# Patient Record
Sex: Male | Born: 1955 | Race: White | Hispanic: No | Marital: Married | Smoking: Former smoker
Health system: Southern US, Community
[De-identification: ages and names within clinical notes are randomized; demographics above are authoritative.]

## PROBLEM LIST (undated history)

## (undated) DIAGNOSIS — Z87442 Personal history of urinary calculi: Secondary | ICD-10-CM

## (undated) DIAGNOSIS — E291 Testicular hypofunction: Secondary | ICD-10-CM

## (undated) DIAGNOSIS — G8929 Other chronic pain: Secondary | ICD-10-CM

## (undated) DIAGNOSIS — M1990 Unspecified osteoarthritis, unspecified site: Secondary | ICD-10-CM

## (undated) DIAGNOSIS — N400 Enlarged prostate without lower urinary tract symptoms: Secondary | ICD-10-CM

## (undated) DIAGNOSIS — M549 Dorsalgia, unspecified: Secondary | ICD-10-CM

## (undated) HISTORY — PX: FINGER AMPUTATION: SHX636

## (undated) HISTORY — PX: COLONOSCOPY: SHX174

## (undated) HISTORY — PX: LUMBAR FUSION: SHX111

## (undated) HISTORY — DX: Testicular hypofunction: E29.1

---

## 1998-06-02 ENCOUNTER — Encounter: Admission: RE | Admit: 1998-06-02 | Discharge: 1998-08-31 | Payer: Self-pay | Admitting: Anesthesiology

## 1998-09-16 ENCOUNTER — Encounter: Admission: RE | Admit: 1998-09-16 | Discharge: 1998-12-15 | Payer: Self-pay | Admitting: Anesthesiology

## 1998-12-16 ENCOUNTER — Encounter: Admission: RE | Admit: 1998-12-16 | Discharge: 1999-03-04 | Payer: Self-pay | Admitting: Anesthesiology

## 1999-03-04 ENCOUNTER — Encounter: Admission: RE | Admit: 1999-03-04 | Discharge: 1999-06-02 | Payer: Self-pay | Admitting: Anesthesiology

## 1999-06-02 ENCOUNTER — Encounter: Admission: RE | Admit: 1999-06-02 | Discharge: 1999-08-18 | Payer: Self-pay | Admitting: Anesthesiology

## 1999-06-27 ENCOUNTER — Ambulatory Visit (HOSPITAL_COMMUNITY): Admission: RE | Admit: 1999-06-27 | Discharge: 1999-06-27 | Payer: Self-pay | Admitting: Family Medicine

## 1999-06-27 ENCOUNTER — Encounter: Payer: Self-pay | Admitting: Family Medicine

## 1999-08-18 ENCOUNTER — Encounter: Admission: RE | Admit: 1999-08-18 | Discharge: 1999-11-16 | Payer: Self-pay | Admitting: Anesthesiology

## 1999-10-19 ENCOUNTER — Emergency Department (HOSPITAL_COMMUNITY): Admission: EM | Admit: 1999-10-19 | Discharge: 1999-10-19 | Payer: Self-pay | Admitting: Emergency Medicine

## 1999-10-19 ENCOUNTER — Encounter: Payer: Self-pay | Admitting: Emergency Medicine

## 1999-12-09 ENCOUNTER — Encounter: Admission: RE | Admit: 1999-12-09 | Discharge: 2000-03-08 | Payer: Self-pay | Admitting: Anesthesiology

## 1999-12-16 ENCOUNTER — Ambulatory Visit (HOSPITAL_COMMUNITY): Admission: RE | Admit: 1999-12-16 | Discharge: 1999-12-16 | Payer: Self-pay | Admitting: Gastroenterology

## 1999-12-29 ENCOUNTER — Encounter: Payer: Self-pay | Admitting: Gastroenterology

## 1999-12-29 ENCOUNTER — Ambulatory Visit (HOSPITAL_COMMUNITY): Admission: RE | Admit: 1999-12-29 | Discharge: 1999-12-29 | Payer: Self-pay | Admitting: Gastroenterology

## 2000-04-05 ENCOUNTER — Encounter: Admission: RE | Admit: 2000-04-05 | Discharge: 2000-05-31 | Payer: Self-pay | Admitting: Anesthesiology

## 2000-05-31 ENCOUNTER — Encounter: Admission: RE | Admit: 2000-05-31 | Discharge: 2000-08-29 | Payer: Self-pay | Admitting: Anesthesiology

## 2000-08-28 ENCOUNTER — Encounter: Admission: RE | Admit: 2000-08-28 | Discharge: 2000-11-26 | Payer: Self-pay | Admitting: Anesthesiology

## 2000-12-07 ENCOUNTER — Encounter: Admission: RE | Admit: 2000-12-07 | Discharge: 2000-12-07 | Payer: Self-pay | Admitting: Gastroenterology

## 2000-12-07 ENCOUNTER — Encounter: Payer: Self-pay | Admitting: Gastroenterology

## 2000-12-12 ENCOUNTER — Encounter: Admission: RE | Admit: 2000-12-12 | Discharge: 2001-03-12 | Payer: Self-pay | Admitting: Anesthesiology

## 2001-01-30 ENCOUNTER — Encounter: Payer: Self-pay | Admitting: Gastroenterology

## 2001-01-30 ENCOUNTER — Encounter: Admission: RE | Admit: 2001-01-30 | Discharge: 2001-01-30 | Payer: Self-pay | Admitting: Gastroenterology

## 2001-03-11 ENCOUNTER — Encounter: Admission: RE | Admit: 2001-03-11 | Discharge: 2001-06-09 | Payer: Self-pay | Admitting: Anesthesiology

## 2001-07-29 ENCOUNTER — Encounter: Admission: RE | Admit: 2001-07-29 | Discharge: 2001-08-24 | Payer: Self-pay | Admitting: Anesthesiology

## 2003-03-03 ENCOUNTER — Emergency Department (HOSPITAL_COMMUNITY): Admission: EM | Admit: 2003-03-03 | Discharge: 2003-03-04 | Payer: Self-pay | Admitting: *Deleted

## 2003-04-24 ENCOUNTER — Ambulatory Visit (HOSPITAL_COMMUNITY): Admission: RE | Admit: 2003-04-24 | Discharge: 2003-04-24 | Payer: Self-pay | Admitting: General Surgery

## 2003-09-19 ENCOUNTER — Emergency Department (HOSPITAL_COMMUNITY): Admission: EM | Admit: 2003-09-19 | Discharge: 2003-09-19 | Payer: Self-pay | Admitting: *Deleted

## 2003-09-19 ENCOUNTER — Encounter: Payer: Self-pay | Admitting: *Deleted

## 2007-11-14 ENCOUNTER — Emergency Department (HOSPITAL_COMMUNITY): Admission: EM | Admit: 2007-11-14 | Discharge: 2007-11-14 | Payer: Self-pay | Admitting: Emergency Medicine

## 2007-11-14 LAB — CONVERTED CEMR LAB
Albumin: 3.6 g/dL
Alkaline Phosphatase: 58 units/L
BUN: 8 mg/dL
Calcium: 8.4 mg/dL
Chloride: 106 meq/L
Creatinine, Ser: 0.74 mg/dL
Glucose, Bld: 98 mg/dL
Hemoglobin: 13.4 g/dL
Lymphocytes Relative: 21 %
Monocytes Absolute: 0.5 10*3/uL
Monocytes Relative: 8 %
Neutro Abs: 4.6 10*3/uL
Potassium: 3.8 meq/L
RBC: 4.95 M/uL

## 2007-12-30 ENCOUNTER — Ambulatory Visit: Payer: Self-pay | Admitting: Internal Medicine

## 2007-12-30 DIAGNOSIS — M545 Low back pain, unspecified: Secondary | ICD-10-CM | POA: Insufficient documentation

## 2007-12-30 DIAGNOSIS — Z8719 Personal history of other diseases of the digestive system: Secondary | ICD-10-CM

## 2007-12-30 DIAGNOSIS — J309 Allergic rhinitis, unspecified: Secondary | ICD-10-CM | POA: Insufficient documentation

## 2007-12-30 DIAGNOSIS — N401 Enlarged prostate with lower urinary tract symptoms: Secondary | ICD-10-CM

## 2007-12-30 DIAGNOSIS — M129 Arthropathy, unspecified: Secondary | ICD-10-CM | POA: Insufficient documentation

## 2007-12-30 DIAGNOSIS — N318 Other neuromuscular dysfunction of bladder: Secondary | ICD-10-CM | POA: Insufficient documentation

## 2007-12-30 DIAGNOSIS — R5383 Other fatigue: Secondary | ICD-10-CM

## 2007-12-30 DIAGNOSIS — R5381 Other malaise: Secondary | ICD-10-CM | POA: Insufficient documentation

## 2007-12-30 DIAGNOSIS — N400 Enlarged prostate without lower urinary tract symptoms: Secondary | ICD-10-CM | POA: Insufficient documentation

## 2007-12-30 DIAGNOSIS — K219 Gastro-esophageal reflux disease without esophagitis: Secondary | ICD-10-CM | POA: Insufficient documentation

## 2007-12-30 LAB — CONVERTED CEMR LAB
Blood in Urine, dipstick: NEGATIVE
Nitrite: NEGATIVE
Specific Gravity, Urine: 1.015
WBC Urine, dipstick: NEGATIVE

## 2007-12-31 ENCOUNTER — Telehealth (INDEPENDENT_AMBULATORY_CARE_PROVIDER_SITE_OTHER): Payer: Self-pay | Admitting: *Deleted

## 2007-12-31 LAB — CONVERTED CEMR LAB
Basophils Absolute: 0 10*3/uL (ref 0.0–0.1)
Basophils Relative: 0 % (ref 0–1)
CO2: 25 meq/L (ref 19–32)
Calcium: 9.3 mg/dL (ref 8.4–10.5)
Creatinine, Ser: 0.78 mg/dL (ref 0.40–1.50)
Eosinophils Relative: 3 % (ref 0–5)
HCT: 45.4 % (ref 39.0–52.0)
Hemoglobin: 14.8 g/dL (ref 13.0–17.0)
MCHC: 32.6 g/dL (ref 30.0–36.0)
MCV: 83.9 fL (ref 78.0–100.0)
Monocytes Absolute: 0.8 10*3/uL (ref 0.1–1.0)
PSA: 0.61 ng/mL (ref 0.10–4.00)
RDW: 13.1 % (ref 11.5–15.5)
Testosterone: 160.06 ng/dL — ABNORMAL LOW (ref 350–890)

## 2008-01-01 ENCOUNTER — Ambulatory Visit: Payer: Self-pay | Admitting: Internal Medicine

## 2008-01-01 DIAGNOSIS — E349 Endocrine disorder, unspecified: Secondary | ICD-10-CM | POA: Insufficient documentation

## 2008-01-01 DIAGNOSIS — E291 Testicular hypofunction: Secondary | ICD-10-CM

## 2008-02-03 ENCOUNTER — Ambulatory Visit: Payer: Self-pay | Admitting: Internal Medicine

## 2008-03-02 ENCOUNTER — Ambulatory Visit: Payer: Self-pay | Admitting: Internal Medicine

## 2008-03-30 ENCOUNTER — Ambulatory Visit: Payer: Self-pay | Admitting: Internal Medicine

## 2008-03-31 LAB — CONVERTED CEMR LAB
Albumin: 4 g/dL (ref 3.5–5.2)
Alkaline Phosphatase: 55 units/L (ref 39–117)
Basophils Absolute: 0 10*3/uL (ref 0.0–0.1)
Basophils Relative: 0 % (ref 0–1)
Eosinophils Relative: 4 % (ref 0–5)
HCT: 45.1 % (ref 39.0–52.0)
Lymphocytes Relative: 22 % (ref 12–46)
Platelets: 186 10*3/uL (ref 150–400)
RDW: 13.6 % (ref 11.5–15.5)
Total Bilirubin: 0.6 mg/dL (ref 0.3–1.2)

## 2008-04-04 ENCOUNTER — Emergency Department (HOSPITAL_COMMUNITY): Admission: EM | Admit: 2008-04-04 | Discharge: 2008-04-04 | Payer: Self-pay | Admitting: Emergency Medicine

## 2008-04-29 ENCOUNTER — Ambulatory Visit: Payer: Self-pay | Admitting: Internal Medicine

## 2008-05-14 ENCOUNTER — Ambulatory Visit: Payer: Self-pay | Admitting: Internal Medicine

## 2008-05-14 DIAGNOSIS — J069 Acute upper respiratory infection, unspecified: Secondary | ICD-10-CM | POA: Insufficient documentation

## 2008-05-28 ENCOUNTER — Ambulatory Visit: Payer: Self-pay | Admitting: Internal Medicine

## 2008-06-25 ENCOUNTER — Ambulatory Visit: Payer: Self-pay | Admitting: Internal Medicine

## 2008-06-30 LAB — CONVERTED CEMR LAB
AST: 15 units/L (ref 0–37)
Albumin: 4.2 g/dL (ref 3.5–5.2)
Alkaline Phosphatase: 54 units/L (ref 39–117)
Eosinophils Relative: 4 % (ref 0–5)
HCT: 45.4 % (ref 39.0–52.0)
Hemoglobin: 14.9 g/dL (ref 13.0–17.0)
Indirect Bilirubin: 0.6 mg/dL (ref 0.0–0.9)
Lymphocytes Relative: 21 % (ref 12–46)
Lymphs Abs: 1.6 10*3/uL (ref 0.7–4.0)
Monocytes Absolute: 0.7 10*3/uL (ref 0.1–1.0)
Monocytes Relative: 9 % (ref 3–12)
RBC: 5.42 M/uL (ref 4.22–5.81)
Total Protein: 6.7 g/dL (ref 6.0–8.3)
WBC: 7.4 10*3/uL (ref 4.0–10.5)

## 2008-07-23 ENCOUNTER — Telehealth (INDEPENDENT_AMBULATORY_CARE_PROVIDER_SITE_OTHER): Payer: Self-pay | Admitting: *Deleted

## 2008-07-27 ENCOUNTER — Ambulatory Visit: Payer: Self-pay | Admitting: Internal Medicine

## 2008-07-27 DIAGNOSIS — D485 Neoplasm of uncertain behavior of skin: Secondary | ICD-10-CM

## 2008-08-11 ENCOUNTER — Telehealth (INDEPENDENT_AMBULATORY_CARE_PROVIDER_SITE_OTHER): Payer: Self-pay | Admitting: *Deleted

## 2008-09-28 ENCOUNTER — Ambulatory Visit: Payer: Self-pay | Admitting: Internal Medicine

## 2008-10-01 LAB — CONVERTED CEMR LAB
AST: 26 units/L (ref 0–37)
Albumin: 4.4 g/dL (ref 3.5–5.2)
Alkaline Phosphatase: 48 units/L (ref 39–117)
BUN: 7 mg/dL (ref 6–23)
Basophils Relative: 0 % (ref 0–1)
Eosinophils Absolute: 0.2 10*3/uL (ref 0.0–0.7)
Eosinophils Relative: 3 % (ref 0–5)
HCT: 51.3 % (ref 39.0–52.0)
MCHC: 32.2 g/dL (ref 30.0–36.0)
MCV: 84.1 fL (ref 78.0–100.0)
Monocytes Relative: 12 % (ref 3–12)
Neutrophils Relative %: 63 % (ref 43–77)
PSA: 0.52 ng/mL (ref 0.10–4.00)
Platelets: 226 10*3/uL (ref 150–400)
Potassium: 4.9 meq/L (ref 3.5–5.3)
Sodium: 141 meq/L (ref 135–145)
Total Bilirubin: 0.9 mg/dL (ref 0.3–1.2)

## 2008-11-02 ENCOUNTER — Ambulatory Visit: Payer: Self-pay | Admitting: Internal Medicine

## 2008-11-06 ENCOUNTER — Telehealth (INDEPENDENT_AMBULATORY_CARE_PROVIDER_SITE_OTHER): Payer: Self-pay | Admitting: Internal Medicine

## 2008-11-09 ENCOUNTER — Encounter (INDEPENDENT_AMBULATORY_CARE_PROVIDER_SITE_OTHER): Payer: Self-pay | Admitting: Internal Medicine

## 2008-11-17 ENCOUNTER — Encounter (INDEPENDENT_AMBULATORY_CARE_PROVIDER_SITE_OTHER): Payer: Self-pay | Admitting: Internal Medicine

## 2008-12-01 ENCOUNTER — Telehealth (INDEPENDENT_AMBULATORY_CARE_PROVIDER_SITE_OTHER): Payer: Self-pay | Admitting: *Deleted

## 2008-12-28 ENCOUNTER — Ambulatory Visit: Payer: Self-pay | Admitting: Internal Medicine

## 2008-12-29 LAB — CONVERTED CEMR LAB
Alkaline Phosphatase: 60 units/L (ref 39–117)
BUN: 10 mg/dL (ref 6–23)
CO2: 22 meq/L (ref 19–32)
Creatinine, Ser: 0.77 mg/dL (ref 0.40–1.50)
Eosinophils Absolute: 0.2 10*3/uL (ref 0.0–0.7)
Eosinophils Relative: 3 % (ref 0–5)
Glucose, Bld: 92 mg/dL (ref 70–99)
HCT: 51 % (ref 39.0–52.0)
Hemoglobin: 16.6 g/dL (ref 13.0–17.0)
Lymphocytes Relative: 21 % (ref 12–46)
Lymphs Abs: 1.3 10*3/uL (ref 0.7–4.0)
MCV: 83.3 fL (ref 78.0–100.0)
Monocytes Absolute: 0.6 10*3/uL (ref 0.1–1.0)
Monocytes Relative: 9 % (ref 3–12)
Platelets: 205 10*3/uL (ref 150–400)
Testosterone: 229.71 ng/dL — ABNORMAL LOW (ref 350–890)
Total Bilirubin: 0.9 mg/dL (ref 0.3–1.2)
Total Protein: 6.6 g/dL (ref 6.0–8.3)
WBC: 6.2 10*3/uL (ref 4.0–10.5)

## 2009-03-29 ENCOUNTER — Ambulatory Visit: Payer: Self-pay | Admitting: Internal Medicine

## 2009-03-30 ENCOUNTER — Encounter (INDEPENDENT_AMBULATORY_CARE_PROVIDER_SITE_OTHER): Payer: Self-pay | Admitting: Internal Medicine

## 2009-03-30 LAB — CONVERTED CEMR LAB
FSH: 1.1 milliintl units/mL — ABNORMAL LOW (ref 1.4–18.1)
Iron: 174 ug/dL — ABNORMAL HIGH (ref 42–165)
Saturation Ratios: 55 % (ref 20–55)
TIBC: 319 ug/dL (ref 215–435)

## 2009-04-01 DIAGNOSIS — E230 Hypopituitarism: Secondary | ICD-10-CM

## 2009-04-01 LAB — CONVERTED CEMR LAB
ALT: 28 units/L (ref 0–53)
Alkaline Phosphatase: 65 units/L (ref 39–117)
Bilirubin, Direct: 0.2 mg/dL (ref 0.0–0.3)
Eosinophils Absolute: 0.2 10*3/uL (ref 0.0–0.7)
Eosinophils Relative: 3 % (ref 0–5)
HCT: 50.2 % (ref 39.0–52.0)
Indirect Bilirubin: 0.7 mg/dL (ref 0.0–0.9)
Lymphocytes Relative: 19 % (ref 12–46)
Lymphs Abs: 1.4 10*3/uL (ref 0.7–4.0)
MCV: 83.4 fL (ref 78.0–100.0)
Monocytes Relative: 8 % (ref 3–12)
PSA: 0.72 ng/mL (ref 0.10–4.00)
Platelets: 195 10*3/uL (ref 150–400)
RBC: 6.02 M/uL — ABNORMAL HIGH (ref 4.22–5.81)
WBC: 7.5 10*3/uL (ref 4.0–10.5)

## 2009-04-07 ENCOUNTER — Encounter (INDEPENDENT_AMBULATORY_CARE_PROVIDER_SITE_OTHER): Payer: Self-pay | Admitting: Internal Medicine

## 2009-04-07 ENCOUNTER — Ambulatory Visit (HOSPITAL_COMMUNITY): Admission: RE | Admit: 2009-04-07 | Discharge: 2009-04-07 | Payer: Self-pay | Admitting: Internal Medicine

## 2011-01-24 NOTE — Assessment & Plan Note (Signed)
Summary: testosterone injection and education of wife   Vital Signs:  Patient Profile:   55 Years Old Male Height:     68 inches O2 Sat:      96 % O2 treatment:    Room Air Pulse rate:   64 / minute Resp:     9 per minute BP sitting:   118 / 78  (right arm)  Vitals Entered By: Lutricia Horsfall (June 25, 2008 11:34 AM)                 Chief Complaint:  testosterone.    Current Allergies: No known allergies         Complete Medication List: 1)  Methadone Hcl 5 Mg Tabs (Methadone hcl) 2)  Valium 5 Mg Tabs (Diazepam) 3)  Flomax 0.4 Mg Cp24 (Tamsulosin hcl) .Marland Kitchen.. 1 by mouth at bedtime    ]  Medication Administration  Injection # 1:    Medication: Testosterone Cypionat 200mg  ing    Diagnosis: HYPOGONADISM, MALE (ICD-257.2)    Route: IM    Site: L deltoid    Exp Date: 11/11    Lot #: [id number]    Mfr: pfizer    Patient tolerated injection without complications    Given by: Lutricia Horsfall (June 25, 2008 11:34 AM)  Orders Added: 1)  Testosterone Cypionat 200mg  ing [J1080] 2)  Admin of Therapeutic Inj  intramuscular or subcutaneous [96372] 3)  T-Hepatic Function [80076-22960] 4)  T-CBC w/Diff [16109-60454] 5)  T-PSA  [[id number]] 6)  T-Testosterone; Total [phone] Wife instructed on giving IM injections.  Wife able to return demonstration and successfully give injection to patient.  Wife and patient instructed that if they have questions or concerns prior to next injection thaey should bring medication and supplies to office and we can go over injections again.  Pt and wife verbalized understanding.  Sherrie [place]  June 25, 2008 11:37 AM   Labs drawn.

## 2011-05-12 NOTE — H&P (Signed)
[facility]  Patient:    Bernard Hill, Bernard Hill                        MRN: [id number] Attending:  Thyra Breed, M.D.                         History and Physical  NO DICTATION. DD:  02/11/01 TD:  02/11/01 Job: [id number]

## 2011-05-12 NOTE — H&P (Signed)
[facility]  Patient:    Bernard Hill, Bernard Hill                      MRN: [id number] Adm. Date:  [id number] Disc. Date: [id number] Attending:  Thyra Breed CC:         Donzetta Sprung, M.D.   History and Physical  FOLLOW-UP EVALUATION:  [name] comes in for follow-up evaluation of his chronic low back pain on the basis of lumbar spondylosis with underlying lumbar radiculopathy. He has noted that his pain seems to be worse since reducing the dose of the methadone, and he saw one of Dr. Garner Nash associates who apparently frustrated him more than helped him with regard to his edema. He did get some workup done, but he walked away frustrated that his concerns were not taken seriously. He is hoping to see Dr. Reuel Boom in October.  He continues on methadone 5 mg one p.o. q.6h., Valium 5 mg p.r.n., and Topamax 25 mg three times a day. He does not feel as though the Topamax is quite as helpful as it has been at suppressing his appetite or helping with his pain control.  He denies any new symptoms but continues to have pain on the left lower side of his back and the lateral aspect of his feet and MTPs. He has noted that walking and swimming have helped, but prolonged standing or sitting will exacerbate his pain.  PHYSICAL EXAMINATION:  VITAL SIGNS:  Blood pressure is 119/75, heart rate is 96, respiratory rate is 16, O2 saturation is 97%, pain level is 6/10.  NEUROLOGICAL:  He has trace edema of the lower extremities to the ankles bilaterally. Deep tendon reflexes were symmetric at the knees, hypoactive at ankles.  IMPRESSION: 1. Lumbar spondylosis with underlying lumbar radiculopathy to the right. 2. Edema.  DISPOSITION: 1. Continue on current dose of methadone 5 mg one p.o. q.6h., #120 with no    refill. 2. Increase Topamax to 25 mg two tablets three times a day, #100 with three    refills. 3. Continue on Valium. 4. Follow up with me in four to eight weeks. 5.  He is to let us know in four weeks if he does not have an appointment so    that we can ascertain whether he is tolerating the Topamax with the    methadone well. DD:  06/04/01 TD:  06/04/01 Job: [id number]

## 2011-05-12 NOTE — H&P (Signed)
[facility]  Patient:    Bernard Hill, Bernard Hill                      MRN: [id number] Adm. Date:  [id number] Attending:  Thyra Breed CC:         Stefani Dama, M.D.  Kari Baars, M.D.   History and Physical  FOLLOW-UP EVALUATION:  [name] comes in for follow-up evaluation of his chronic low back pain on the basis of spondylosis with underlying lumbar radiculopathy. The patient has done about the same on his current medical regimen as he did previously. He has noted that the increased dose of Topamax has not caused any side effects and seems to be helping suppress his appetite better. He continues to need the Valium and the methadone.  He is noting that sitting, standing, or laying down tend to worsen his pain. Swimming does help to a degree. He localizes most of his pain to the lumbosacral region.  PHYSICAL EXAMINATION:  VITAL SIGNS:  Blood pressure is 122/71, heart rate is 86, respiratory rate is 16, O2 saturation is 97%, pain level is 6/10.  NEUROLOGICAL:  Straight leg raise signs are negative. Deep tendon reflexes were symmetric. Gait is intact.  IMPRESSION: 1. Lumbar spondylosis with underlying lumbar radiculopathy. 2. Edema, markedly improved.  DISPOSITION: 1. Continue on current dose of methadone 5 mg one p.o. q.6h., #120 with no    refill. 2. Continue on current dose of Topamax. 3. Valium 5 mg one p.o. q.6h. p.r.n., #100 with two refills. 4. Follow up with me in eight weeks. DD:  07/30/01 TD:  07/30/01 Job: [id number]

## 2011-05-12 NOTE — H&P (Signed)
[facility]  Patient:    [name], [name]                      MRN: [id number] Adm. Date:  [id number] Attending:  Thyra Breed CC:         Donzetta Sprung, M.D. in [place], [place]   History and Physical  FOLLOW-UP EVALUATION:  Bernard Hill comes in today for follow-up. He is complaining of increased pain out into his right lower extremity, as well as to a lesser extent to his left lower extremity. He describes it more as cramping in the calves and burning dysesthesias down his leg. It is made worse by driving, sitting, or standing. He has not noted a great deal of improvement on the increased dose of methadone, Valium, or the Elavil. He is very worried about the fact that it is so much worse. He rates his pain at 8/10. This is higher than his last visit.  PHYSICAL EXAMINATION:  VITAL SIGNS:  Blood pressure is 136/76, heart rate is 98, respiratory rate is 18, O2 saturation is 95%, pain level is 8/10, temperature is 96.8.  NEUROLOGICAL:  Straight leg raise signs were negative. Deep tendon reflexes showed knee symmetric, right ankle absent, left 1+. His motor strength is unchanged from previously. Hyperextension of his back increases his discomfort.  IMPRESSION:  Lumbar spondylosis with radiculopathy into the right lower extremity with increased pain. Rule out possible recurrent disk herniation.  DISPOSITION: 1. The patient wishes to hold off on any radiographic evaluations for the    time being. 2. Discontinue methadone. 3. OxyContin 20 mg one p.o. b.i.d., #60 with no refill. He is to call in 7    days to find out whether he is doing well with this. 4. Continue with the Valium 5 mg one p.o. q.6-8h., #100 with two refills. 5. Continue with Elavil. 6. Follow up with me in 4 weeks. If he is not improved at that time, we will    consider repeat MRI of his lower back. DD:  01/14/01 TD:  01/14/01 Job: [id number]

## 2011-05-12 NOTE — Consult Note (Signed)
[facility]  Patient:    Bernard Hill, Bernard Hill                      MRN: [id number] Proc. Date: 10/23/00 Adm. Date:  [id number] Attending:  Thyra Breed CC:         Donzetta Sprung, M.D., [place], [place]   Consultation Report  FOLLOWUP EVALUATION:  The patient comes in for followup evaluation of his chronic low back pain on the basis of lumbar spondylosis with chronic lumbar radiculopathy.  Since his last evaluation, he continues to exercise regularly by swimming at least twice a week and has noted that this has helped to keep his pain down even better than last year as the colder weather is coming in. He continues on the methadone 10 mg twice a day, which is constipating him. He noted with the Baclofen, he developed a breakout on his face which was blistering, as well as in his mouth.  He stopped this and three days later, this resolved.  He is back on the Valium and it seems to be working better.  EXAMINATION  VITAL SIGNS:  Blood pressure 121/72.  Heart rate is 88.  Respiratory rate is 16.  O2 saturation is 98%.  Pain level is 5/10.  NEUROLOGIC:  He demonstrates symmetric deep tendon reflexes at the knees, attenuated reflex at the right relative to the left ankle.  Motor is unchanged.  IMPRESSION:  Lumbar spondylosis with chronic lumbar radiculopathy.  DISPOSITION 1. Continue on the methadone 10 mg 1 p.o. b.i.d., #60 with no refill. 2. Continue on Valium 5 mg 1 p.o. q.8h., #100 with 2 refills. 3. I gave him the laxative protocol from [place] to review.  I    advised him that as an alternative to Senokot, he could use Senna-S. 4. Follow up with me in eight weeks.  He is aware that he will need to get a    prescription for the methadone in the interim and he will contact us when    he needs this sent to him. DD:  10/23/00 TD:  10/23/00 Job: [id number]

## 2011-05-12 NOTE — H&P (Signed)
[facility]  Patient:    Bernard Hill, Bernard Hill                      MRN: [id number] Adm. Date:  [id number] Attending:  Thyra Breed CC:         Donzetta Sprung, M.D. in [employer]   History and Physical  FOLLOW-UP EVALUATION:  Bernard Hill comes in for follow-up evaluation of his chronic low back pain on the basis of lumbar spondylosis with radiculopathy into the right lower extremity. Since his last evaluation, the patient has tolerated the Topamax well and continued on his Valium and methadone. He notes that his pain remains about the same as it was previously. He did note some improvements when he was able to get into a pool over the weekend and strongly feels that if he can get back into exercises and lose some weight he will feel better overall.  He describes no change in the quality of his pain or the distribution of his pain. He notes that standing for prolonged periods of time or sitting will increase his discomfort and swimming helps it.  CURRENT MEDICATIONS: 1. Methadone 10 mg one q.8h. 2. Valium 5 mg one p.o. q.8h. p.r.n. 3. Topamax 25 mg at night.  PHYSICAL EXAMINATION:  VITAL SIGNS:  Blood pressure is 138/84, heart rate is 94, respiratory rate is 20, O2 saturation is 97%, pain level is 6.5/10.  NEUROLOGICAL:  His straight leg raise signs are negative. Deep tendon reflexes were symmetric at the knees, absent at the right ankle, 1+ at the left. Motor is unchanged.  IMPRESSION:  Lumbar spondylosis with lumbar radiculopathy into the right lower extremity.  DISPOSITION: 1. Continue on methadone 10 mg one p.o. q.8h., #90 with no refill. 2. Increase Topamax to 25 mg one p.o. b.i.d. x 2 weeks; then if tolerated, one    p.o. t.i.d. 3. Continue on Valium. 4. Follow up with me in 4 weeks. DD:  03/12/01 TD:  03/12/01 Job: [id number]

## 2011-05-12 NOTE — H&P (Signed)
[facility]  Patient:    Bernard Hill, Bernard Hill                      MRN: [id number] Adm. Date:  [id number] Attending:  Thyra Breed CC:         Donzetta Sprung, M.D.   History and Physical  FOLLOWUP EVALUATION  Mr. Malkiewicz comes in for followup evaluation of his chronic low back pain on the basis of lumbar spondylosis with underlying lumbar radiculopathy.  He states he has been doing well except for a fall that he took getting out of the bathtub about five weeks ago.  He was seen by Dr. Luisa Hart who advised him he likely had a rib fracture.  He has not gotten better.  He is concerned that it may be related to his fusion.  He points to the left lower thoracic cage.  His lower extremity discomfort is unchanged from previously, except that he has noted some increased tingling over the lateral aspect of his right hip.  He continues to have pain that radiates down the posterior aspects of both lower extremities.  His right side is worse than his left.  CURRENT MEDICATIONS: 1. Methadone 10 mg b.i.d. 2. Valium p.r.n.  PHYSICAL EXAMINATION:  VITAL SIGNS:  Blood pressure 123/74, heart rate 71, respiratory rate 18, O2 saturation 98%, pain level is 7.5/10.  EXTREMITIES:  Deep tendon reflexes were symmetric at the knees, attenuated at the right ankle relative to the left.  Motor is unchanged.  He exhibits tenderness over his left rib cage which is discreet and is likely reflective of trauma to his rib with probable underlying closed rib fracture.  IMPRESSION: 1. Lumbar spondylosis with lumbar radiculopathy. 2. Closed rib fracture.  DISPOSITION: 1. Continue on methadone 10 mg one p.o. b.i.d. #60 with no refill. 2. Continue with Valium. 3. Trial of Lidoderm patch for his rib discomfort. 4. Follow up with me in four weeks. DD:  07/26/00 TD:  07/26/00 Job: [id number]

## 2011-05-12 NOTE — H&P (Signed)
[facility]  Patient:    Bernard Hill, Bernard Hill                      MRN: [id number] Adm. Date:  [id number] Attending:  Thyra Breed CC:         Lucita Lora. Reuel Boom, M.D.                         History and Physical  FOLLOWUP EVALUATION:  The patient comes in for followup evaluation of his chronic low back pain with radiation into both lower extremities, right greater than left.  Since his last evaluation, he noted that the OxyContin reduced his pain by about 25% but he is not impressed that it is that much better than the methadone.  He feels a lot of his improvement is just secondary to the effects of time.  He notes that sitting for long periods or standing for long periods will increase his discomfort; walking and swimming will decrease his discomfort.  He continues on the Valium 5 mg one p.o. q.6h. and his OxyContin at 20 mg b.i.d.  PHYSICAL EXAMINATION  VITAL SIGNS:  Blood pressure 142/64, heart rate is 68, respiratory rate is 22, O2 saturation is 96% and pain level is 6/10.  NEUROLOGIC:  Straight leg raise signs were negative.  Deep tendon reflexes were symmetric at the knees, right ankle absent, left 1+.  Motor strength is unchanged from previously.  IMPRESSION:  Lumbar spondylosis with radicular-type discomfort into the right lower extremity.  DISPOSITION 1. The patient wishes to go back to the methadone 10 mg 1 p.o. q.8h., #90 with    no refill. 2. Stop OxyContin. 3. Trial of Topamax 25 mg 1 p.o. q.d.; patient was given a prescription for    30 with no refill.  He was advised of the potential side-effects in detail    and the need to call should he develop any problems. 4. Continue with the Valium as previously. 5. Follow up with me in four weeks. DD:  02/11/01 TD:  02/11/01 Job: [id number]

## 2011-05-12 NOTE — Consult Note (Signed)
[facility]  Patient:    Bernard Hill, Bernard Hill                      MRN: [id number] Proc. Date: 08/28/00 Adm. Date:  [id number] Attending:  Thyra Breed CC:         Dr. Reuel Boom   Consultation Report  FOLLOWUP EVALUATION:  [name] comes in for followup evaluation of his chronic low back pain with radiculopathy into the right lower extremity.  Since his last evaluation, he has noted increasing discomfort over his right great toe and cramping in his right thigh.  He states that this seems to be getting the best of him for the time-being.  He notes that his rib pain is markedly improved after using the Lidoderm patches.  CURRENT MEDICATIONS 1. Methadone 10 mg twice a day. 2. Valium p.r.n.  EXAMINATION  VITAL SIGNS:  Blood pressure 123/68, heart rate is 94, respiratory rate is 16, O2 saturation is 97% and pain level is 6.5/10 and temperature is 97.2.  NEUROLOGIC:  The patient demonstrated no change in his neurological exam from previously.  IMPRESSION:  Low back pain on the basis of lumbar spondylosis with radiculopathy.  DISPOSITION 1. Continue on methadone 10 mg one p.o. b.i.d., #60 with no refill. 2. Discontinue Valium. 3. Trial of Baclofen 10 mg one-half tablet one p.o. b.i.d. x 3 days, then    one-half tablet t.i.d., #50 with two refills.  The patient was advised of    the potential risks of this medication in great detail. 4. Follow up with me in four to eight weeks.  He is to let me know if he is    not tolerating the medications well. DD:  08/28/00 TD:  08/29/00 Job: [id number]

## 2011-05-12 NOTE — H&P (Signed)
[facility]  Patient:    Bernard Hill, Bernard Hill                      MRN: [id number] Adm. Date:  [id number] Attending:  Thyra Breed CC:         Lucita Lora. Reuel Boom, M.D., [place], [place]   History and Physical  FOLLOWUP EVALUATION:  Tamarick comes in for followup evaluation of his chronic low back pain on the basis of lumbar spondylosis.  Since his previous evaluation, the patient has noted that he does fairly well during the day with minimal pain until the evening, for which he rates the pain at maximum at 5.5/10 in the evenings.  Currently, he describes no pain, although he rated it at 5.5.  He is on the methadone 10 mg three times a day, Valium 5 mg p.r.n. and Topamax 25 mg twice a day.  He has noted that the Topamax is suppressing his appetite.  He is starting to swim more and notes that this is helping considerably.  He notes that standing increased his discomfort.  PHYSICAL EXAMINATION:  VITAL SIGNS:  Blood pressure 122/75, heart rate 74, respiratory rate is 18, O2 saturation is 99% and pain level is 5/10 in the evening, 0/10 currently.  NEUROMUSCULAR:  Deep tendon reflexes were symmetric at the knees, absent at the right ankle, 1+ at the left ankle.  His gait is intact.  He does have a ratchety motion when he goes from a sitting to a standing position.  He also demonstrates 1+ pitting edema.  IMPRESSION: 1. Lumbar spondylosis with underlying lumbar radiculopathy into the right    lower extremity. 2. Edema.  DISPOSITION: 1. Reduce methadone to 5 mg 1 p.o. q.6h., #120 with no refill, to see whether    this reduces his edema. 2. Increase his Topamax to 25 mg 1 p.o. q.8h., #100 with 2 refills. 3. Continue Valium. 4. Follow up with me in eight weeks. 5. He is to let us know when he runs low on his medications for prescriptions. D:  04/09/01 TD:  04/10/01 Job: [id number]

## 2011-05-12 NOTE — H&P (Signed)
   NAME:  Bernard Hill, Bernard Hill                         ACCOUNT NO.:  [id number]   MEDICAL RECORD NO.:  [id number]                   PATIENT TYPE:   LOCATION:                                       FACILITY:   PHYSICIAN:  Dalia Heading, M.D.               DATE OF BIRTH:  04/05/1956   DATE OF ADMISSION:  DATE OF DISCHARGE:                                HISTORY & PHYSICAL   CHIEF COMPLAINT:  Hematochezia.   HISTORY OF PRESENT ILLNESS:  Patient is a 55 year old white male who is  referred for a colonoscopy.  He has been having hematochezia over the past  few weeks.  This started after taking an antibiotic for prostatitis.  He has  had an EGD in the past.  He denies any lightheadedness, weight loss, fever,  abdominal pain, significant constipation, diarrhea, or melena.  He denies  hemorrhoidal problems.  He has never had a colonoscopy.  There is no  immediate family history of colon carcinoma.   PAST MEDICAL HISTORY:  Includes bladder problems.   PAST SURGICAL HISTORY:  Back surgery.   CURRENT MEDICATIONS:  1. Morphine.  2. UroXatral.  3. Ciprofloxacin.   ALLERGIES:  No know drug allergies.   REVIEW OF SYSTEMS:  Patient denies drinking or smoking.   PHYSICAL EXAMINATION:  GENERAL:  Patient is a well-developed, well-nourished  white male in no acute distress.  VITAL SIGNS:  He is afebrile, and vital signs are stable.  LUNGS:  Clear to auscultation with equal breath sounds bilaterally.  HEART EXAMINATION:  Reveals a regular rate and rhythm without S3, S4, or  murmurs.  ABDOMEN:  Benign.  RECTAL EXAMINATION:  Deferred to the procedure.   IMPRESSION:  Hematochezia.    PLAN:  The patient is scheduled for a colonoscopy on 04/24/2003.  The risks  and benefits of the procedure, including bleeding and perforation, were  fully explained to the patient, gaining informed consent.                                               Dalia Heading, M.D.    MAJ/MEDQ  D:  04/21/2003  T:   04/21/2003  Job:  [id number]   cc:   Donzetta Sprung  [address]  [place]  [place] [address]  Fax: [phone]

## 2011-05-12 NOTE — H&P (Signed)
[facility]  Patient:    Bernard Hill, Bernard Hill                      MRN: [id number] Adm. Date:  [id number] Attending:  Thyra Breed CC:         Donzetta Sprung, M.D. - [place], [place]                         History and Physical  CHIEF COMPLAINT:  This is a follow-up evaluation.  Syncere comes in for a follow-up evaluation of his chronic low back pain, radiating out into the right lower extremity.  HISTORY OF PRESENT ILLNESS:  Since his previous evaluation, the patient was doing well up until he ran low on his methadone.  He has been only taking one a day for the past two weeks.  As a result, his pain is somewhat increased.  He continues on the Valium b.i.d.  He rates his pain today at about 8/10.  He is back to swimming. He is walking also.  He notes that his pain is exacerbated by walking or by standing for prolonged periods of time.  He describes no change in the distribution of his pain with lower lumbosacral discomfort and pain radiating out into the right lower extremity.  He has no new neurological symptoms.  CURRENT MEDICATIONS: 1. Methadone 10 mg b.i.d. 2. Valium 5 mg b.i.d.  PHYSICAL EXAMINATION:  VITAL SIGNS:  Blood pressure 137/70, heart rate 86, respirations 16, O2 saturation 96%.  Pain level is 8/10.  Temperature 97.2 degrees.  NEUROLOGIC:  Straight leg raising signs are positive on the right side at 60 degrees.  Deep tendon reflexes at the knees and ankles were significant for attenuated right ankle jerk, relative to the left.  Motor examination is unchanged.  IMPRESSION:  Lumbar spondylosis with lumbar radiculopathy.  DISPOSITION: 1. Resume methadone at 10 mg one p.o. b.i.d. #60 with no refill. 2. Continue on Valium 5 mg one p.o. q.8h. #90 with two refills. 3. Continue on glucosamine as tolerated. 4. Follow up with me in eight weeks. 5. The patient was encouraged to continue with his pool swimming, since    it tends to help reduce his  discomfort.  The patient asked about vertebroplasty, and I advised him that this was more for compression fractures.  We also discussed dorsal column stimulators, as he has  friend who has one in place.  I advised him that he seemed to be doing fairly well on his current medical regimen, and I would not recommend changing things. DD:  05/31/00 TD:  05/31/00 Job: [id number]

## 2011-05-12 NOTE — H&P (Signed)
[facility]  Patient:    Bernard Hill, Bernard Hill                      MRN: [id number] Adm. Date:  [id number] Disc. Date: [id number] Attending:  Thyra Breed CC:         Donzetta Sprung, M.D.   History and Physical  FOLLOW UP EVALUATION  HISTORY:  The patient comes in for follow up evaluation of his chronic low back pain on the basis of lumbar spondylosis and chronic lumbar radiculopathy. Since his last evaluation, he has had a lot of stress with his daughter, who apparently has had surgery on her abdomen, with an exploratory subsequent to this.  He states that he has been lifting her quite a bit, and he has developed more of a burning DISCOMFORT in his lower back radiating along the posterior aspects of both lower extremities.  He has felt as though the methadone 10 mg b.i.d. is not quite holding him.  He continues on Valium as previously.  In reviewing his records, it does not look like we have had him on amitriptyline in the past, and I advised that this might be the time to try this, to try and reduce some of the burning discomfort.  He is very open to this.  He denied any new weakness, bowel or bladder incontinence.  PHYSICAL EXAMINATION:  VITAL SIGNS:  Blood pressure 135/78, heart rate 100, respiratory rate 20, O2 saturations 96%, pain level 6/10.  NEUROLOGIC:  Straight leg raise signs are negative.  Deep tendon reflexes are symmetric at the knees.  Right ankle jerk is absent, left is 1+.  He has increased pain on hyperextension of his back.  IMPRESSION:  Lumbar spondylosis and radiculopathy into the lumbar region, which seems more prominent than previously.  DISPOSITION: 1. Increase methadone to 10 mg, one p.o. q.8h., #90 with no refills. 2. Continue on Valium at the current dose. 3. Trial of low dose amitriptyline 10 mg, one p.o. q.p.m.   I reviewed the    potential side effects of this medication in detail with him. 4. Continue with Senokot or  laxative protocol. 5. Follow up with me in four weeks.  If he is not improving in four weeks, we    may need to go ahead and repeat his MRI scan or CT scan to make sure that    he has not had any progression of his disease which might be surgically    amenable. DD:  12/12/00 TD:  12/13/00 Job: [id number]

## 2011-10-03 LAB — CBC
HCT: 39.8
MCV: 80.4
Platelets: 209
RDW: 13.3

## 2011-10-03 LAB — POCT CARDIAC MARKERS
CKMB, poc: <1 — ABNORMAL LOW
Myoglobin, poc: 43.2
Myoglobin, poc: 48.1
Troponin i, poc: <0.05

## 2011-10-03 LAB — COMPREHENSIVE METABOLIC PANEL
Albumin: 3.6
BUN: 8
Calcium: 8.4
Creatinine, Ser: 0.74
GFR calc Af Amer: >60
Total Protein: 6.2

## 2011-10-03 LAB — DIFFERENTIAL
Basophils Absolute: 0.1
Lymphocytes Relative: 21
Lymphs Abs: 1.4
Monocytes Absolute: 0.5
Monocytes Relative: 8
Neutro Abs: 4.6

## 2014-08-28 ENCOUNTER — Other Ambulatory Visit: Payer: Self-pay | Admitting: Orthopedic Surgery

## 2014-08-28 ENCOUNTER — Encounter (HOSPITAL_BASED_OUTPATIENT_CLINIC_OR_DEPARTMENT_OTHER): Payer: Self-pay | Admitting: *Deleted

## 2014-08-28 NOTE — Progress Notes (Signed)
No labs needed

## 2014-09-01 ENCOUNTER — Encounter (HOSPITAL_BASED_OUTPATIENT_CLINIC_OR_DEPARTMENT_OTHER): Payer: Medicare Other | Admitting: Anesthesiology

## 2014-09-01 ENCOUNTER — Encounter (HOSPITAL_BASED_OUTPATIENT_CLINIC_OR_DEPARTMENT_OTHER)
Admission: RE | Disposition: A | Discharge status: Home / Self Care | Payer: Self-pay | Source: Ambulatory Visit | Attending: Orthopedic Surgery

## 2014-09-01 ENCOUNTER — Encounter (HOSPITAL_BASED_OUTPATIENT_CLINIC_OR_DEPARTMENT_OTHER): Payer: Self-pay | Admitting: Orthopedic Surgery

## 2014-09-01 ENCOUNTER — Ambulatory Visit (HOSPITAL_BASED_OUTPATIENT_CLINIC_OR_DEPARTMENT_OTHER)
Admission: RE | Admit: 2014-09-01 | Discharge: 2014-09-01 | Disposition: A | Discharge status: Home / Self Care | Payer: Medicare Other | Source: Ambulatory Visit | Attending: Orthopedic Surgery | Admitting: Orthopedic Surgery

## 2014-09-01 ENCOUNTER — Ambulatory Visit (HOSPITAL_BASED_OUTPATIENT_CLINIC_OR_DEPARTMENT_OTHER): Payer: Medicare Other | Admitting: Anesthesiology

## 2014-09-01 DIAGNOSIS — M129 Arthropathy, unspecified: Secondary | ICD-10-CM | POA: Insufficient documentation

## 2014-09-01 DIAGNOSIS — Z87891 Personal history of nicotine dependence: Secondary | ICD-10-CM | POA: Insufficient documentation

## 2014-09-01 DIAGNOSIS — Z79899 Other long term (current) drug therapy: Secondary | ICD-10-CM | POA: Diagnosis not present

## 2014-09-01 DIAGNOSIS — M66339 Spontaneous rupture of flexor tendons, unspecified forearm: Secondary | ICD-10-CM | POA: Insufficient documentation

## 2014-09-01 DIAGNOSIS — M66349 Spontaneous rupture of flexor tendons, unspecified hand: Secondary | ICD-10-CM | POA: Diagnosis present

## 2014-09-01 DIAGNOSIS — N400 Enlarged prostate without lower urinary tract symptoms: Secondary | ICD-10-CM | POA: Diagnosis not present

## 2014-09-01 DIAGNOSIS — K219 Gastro-esophageal reflux disease without esophagitis: Secondary | ICD-10-CM | POA: Diagnosis not present

## 2014-09-01 HISTORY — DX: Unspecified osteoarthritis, unspecified site: M19.90

## 2014-09-01 HISTORY — DX: Benign prostatic hyperplasia without lower urinary tract symptoms: N40.0

## 2014-09-01 HISTORY — DX: Other chronic pain: G89.29

## 2014-09-01 HISTORY — DX: Dorsalgia, unspecified: M54.9

## 2014-09-01 HISTORY — PX: REPAIR EXTENSOR TENDON: SHX5382

## 2014-09-01 LAB — POCT HEMOGLOBIN-HEMACUE: HEMOGLOBIN: 15.7 g/dL (ref 13.0–17.0)

## 2014-09-01 SURGERY — REPAIR, TENDON, EXTENSOR
Anesthesia: Regional | Site: Finger | Laterality: Right

## 2014-09-01 MED ORDER — BUPIVACAINE-EPINEPHRINE (PF) 0.5% -1:200000 IJ SOLN
INTRAMUSCULAR | Status: DC | PRN
  Administered 2014-09-01: 30 mL via PERINEURAL

## 2014-09-01 MED ORDER — CEFAZOLIN SODIUM 1-5 GM-% IV SOLN
INTRAVENOUS | Status: AC
  Filled 2014-09-01: qty 50

## 2014-09-01 MED ORDER — MIDAZOLAM HCL 2 MG/2ML IJ SOLN
INTRAMUSCULAR | Status: AC
  Filled 2014-09-01: qty 2

## 2014-09-01 MED ORDER — LIDOCAINE HCL (CARDIAC) 20 MG/ML IV SOLN
INTRAVENOUS | Status: DC | PRN
  Administered 2014-09-01: 100 mg via INTRAVENOUS

## 2014-09-01 MED ORDER — FENTANYL CITRATE 0.05 MG/ML IJ SOLN
25.0000 ug | INTRAMUSCULAR | Status: DC | PRN
  Administered 2014-09-01 (×2): 25 ug via INTRAVENOUS

## 2014-09-01 MED ORDER — FENTANYL CITRATE 0.05 MG/ML IJ SOLN
INTRAMUSCULAR | Status: AC
  Filled 2014-09-01: qty 4

## 2014-09-01 MED ORDER — HYDROCODONE-ACETAMINOPHEN 5-325 MG PO TABS: 1.0000 | ORAL_TABLET | Freq: Four times a day (QID) | ORAL | Status: DC | PRN

## 2014-09-01 MED ORDER — CEFAZOLIN SODIUM-DEXTROSE 2-3 GM-% IV SOLR
2.0000 g | INTRAVENOUS | Status: AC
  Administered 2014-09-01: 3 g via INTRAVENOUS

## 2014-09-01 MED ORDER — FENTANYL CITRATE 0.05 MG/ML IJ SOLN
INTRAMUSCULAR | Status: AC
  Filled 2014-09-01: qty 2

## 2014-09-01 MED ORDER — CEFAZOLIN SODIUM-DEXTROSE 2-3 GM-% IV SOLR
INTRAVENOUS | Status: AC
  Filled 2014-09-01: qty 100

## 2014-09-01 MED ORDER — CHLORHEXIDINE GLUCONATE 4 % EX LIQD: 60.0000 mL | Freq: Once | CUTANEOUS | Status: DC

## 2014-09-01 MED ORDER — FENTANYL CITRATE 0.05 MG/ML IJ SOLN
50.0000 ug | INTRAMUSCULAR | Status: DC | PRN
  Administered 2014-09-01: 100 ug via INTRAVENOUS

## 2014-09-01 MED ORDER — CHLORHEXIDINE GLUCONATE 4 % EX LIQD
60.0000 mL | Freq: Once | CUTANEOUS | Status: DC
Start: 2014-09-01 — End: 2014-09-01

## 2014-09-01 MED ORDER — CEFAZOLIN SODIUM-DEXTROSE 2-3 GM-% IV SOLR: 2.0000 g | INTRAVENOUS | Status: DC

## 2014-09-01 MED ORDER — DEXAMETHASONE SODIUM PHOSPHATE 4 MG/ML IJ SOLN
INTRAMUSCULAR | Status: DC | PRN
  Administered 2014-09-01: 8 mg via INTRAVENOUS

## 2014-09-01 MED ORDER — LACTATED RINGERS IV SOLN
INTRAVENOUS | Status: DC
Start: 2014-09-01 — End: 2014-09-01
  Administered 2014-09-01 (×2): via INTRAVENOUS

## 2014-09-01 MED ORDER — MIDAZOLAM HCL 2 MG/2ML IJ SOLN
1.0000 mg | INTRAMUSCULAR | Status: DC | PRN
  Administered 2014-09-01: 2 mg via INTRAVENOUS

## 2014-09-01 MED ORDER — ONDANSETRON HCL 4 MG/2ML IJ SOLN
INTRAMUSCULAR | Status: DC | PRN
  Administered 2014-09-01: 4 mg via INTRAVENOUS

## 2014-09-01 MED ORDER — ONDANSETRON HCL 4 MG/2ML IJ SOLN: 4.0000 mg | Freq: Four times a day (QID) | INTRAMUSCULAR | Status: DC | PRN

## 2014-09-01 MED ORDER — OXYCODONE HCL 5 MG PO TABS: 5.0000 mg | ORAL_TABLET | Freq: Once | ORAL | Status: DC | PRN

## 2014-09-01 MED ORDER — PROPOFOL 10 MG/ML IV BOLUS
INTRAVENOUS | Status: DC | PRN
  Administered 2014-09-01: 200 mg via INTRAVENOUS

## 2014-09-01 MED ORDER — OXYCODONE HCL 5 MG/5ML PO SOLN: 5.0000 mg | Freq: Once | ORAL | Status: DC | PRN

## 2014-09-01 SURGICAL SUPPLY — 81 items
BAG DECANTER FOR FLEXI CONT (MISCELLANEOUS) IMPLANT
BLADE MINI RND TIP GREEN BEAV (BLADE) ×2 IMPLANT
BLADE SURG 15 STRL LF DISP TIS (BLADE) ×1 IMPLANT
BLADE SURG 15 STRL SS (BLADE) ×1
BNDG COHESIVE 3X5 TAN STRL LF (GAUZE/BANDAGES/DRESSINGS) ×2 IMPLANT
BNDG COHESIVE 4X5 TAN STRL (GAUZE/BANDAGES/DRESSINGS) ×2 IMPLANT
BNDG ESMARK 4X9 LF (GAUZE/BANDAGES/DRESSINGS) ×2 IMPLANT
BNDG GAUZE ELAST 4 BULKY (GAUZE/BANDAGES/DRESSINGS) ×2 IMPLANT
CHLORAPREP W/TINT 26ML (MISCELLANEOUS) ×2 IMPLANT
CORDS BIPOLAR (ELECTRODE) ×2 IMPLANT
COTTONBALL LRG STERILE PKG (GAUZE/BANDAGES/DRESSINGS) IMPLANT
COVER MAYO STAND STRL (DRAPES) ×2 IMPLANT
COVER TABLE BACK 60X90 (DRAPES) ×2 IMPLANT
CUFF TOURNIQUET SINGLE 18IN (TOURNIQUET CUFF) IMPLANT
DECANTER SPIKE VIAL GLASS SM (MISCELLANEOUS) IMPLANT
DRAIN TLS ROUND 10FR (DRAIN) IMPLANT
DRAPE EXTREMITY T 121X128X90 (DRAPE) ×2 IMPLANT
DRAPE OEC MINIVIEW 54X84 (DRAPES) IMPLANT
DRAPE SURG 17X23 STRL (DRAPES) ×2 IMPLANT
DRSG KUZMA FLUFF (GAUZE/BANDAGES/DRESSINGS) IMPLANT
GAUZE SPONGE 4X4 12PLY STRL (GAUZE/BANDAGES/DRESSINGS) ×2 IMPLANT
GAUZE SPONGE 4X4 16PLY XRAY LF (GAUZE/BANDAGES/DRESSINGS) IMPLANT
GAUZE XEROFORM 1X8 LF (GAUZE/BANDAGES/DRESSINGS) ×2 IMPLANT
GLOVE BIO SURGEON STRL SZ7.5 (GLOVE) ×2 IMPLANT
GLOVE BIOGEL PI IND STRL 7.0 (GLOVE) ×2 IMPLANT
GLOVE BIOGEL PI IND STRL 8 (GLOVE) ×1 IMPLANT
GLOVE BIOGEL PI IND STRL 8.5 (GLOVE) ×1 IMPLANT
GLOVE BIOGEL PI INDICATOR 7.0 (GLOVE) ×2
GLOVE BIOGEL PI INDICATOR 8 (GLOVE) ×1
GLOVE BIOGEL PI INDICATOR 8.5 (GLOVE) ×1
GLOVE ECLIPSE 6.5 STRL STRAW (GLOVE) ×2 IMPLANT
GLOVE SURG ORTHO 8.0 STRL STRW (GLOVE) ×2 IMPLANT
GOWN STRL REUS W/ TWL LRG LVL3 (GOWN DISPOSABLE) ×1 IMPLANT
GOWN STRL REUS W/TWL LRG LVL3 (GOWN DISPOSABLE) ×1
GOWN STRL REUS W/TWL XL LVL3 (GOWN DISPOSABLE) ×4 IMPLANT
K-WIRE .035X4 (WIRE) IMPLANT
LOOP VESSEL MAXI BLUE (MISCELLANEOUS) IMPLANT
NEEDLE 27GAX1X1/2 (NEEDLE) IMPLANT
NEEDLE HYPO 22GX1.5 SAFETY (NEEDLE) IMPLANT
NEEDLE KEITH (NEEDLE) IMPLANT
NS IRRIG 1000ML POUR BTL (IV SOLUTION) ×2 IMPLANT
PACK BASIN DAY SURGERY FS (CUSTOM PROCEDURE TRAY) ×2 IMPLANT
PAD CAST 3X4 CTTN HI CHSV (CAST SUPPLIES) ×1 IMPLANT
PADDING CAST ABS 3INX4YD NS (CAST SUPPLIES)
PADDING CAST ABS 4INX4YD NS (CAST SUPPLIES) ×1
PADDING CAST ABS COTTON 3X4 (CAST SUPPLIES) IMPLANT
PADDING CAST ABS COTTON 4X4 ST (CAST SUPPLIES) ×1 IMPLANT
PADDING CAST COTTON 3X4 STRL (CAST SUPPLIES) ×1
SLEEVE SCD COMPRESS KNEE MED (MISCELLANEOUS) IMPLANT
SLING ARM XL FOAM STRAP (SOFTGOODS) ×2 IMPLANT
SPLINT PLASTER CAST XFAST 3X15 (CAST SUPPLIES) IMPLANT
SPLINT PLASTER XTRA FASTSET 3X (CAST SUPPLIES)
STOCKINETTE 4X48 STRL (DRAPES) ×2 IMPLANT
SUT CHROMIC 5 0 P 3 (SUTURE) IMPLANT
SUT ETHIBOND 3-0 V-5 (SUTURE) IMPLANT
SUT ETHILON 5 0 PC 1 (SUTURE) ×2 IMPLANT
SUT FIBERWIRE 2-0 18 17.9 3/8 (SUTURE)
SUT FIBERWIRE 4-0 18 TAPR NDL (SUTURE) ×2
SUT MERSILENE 2.0 SH NDLE (SUTURE) IMPLANT
SUT MERSILENE 3 0 FS 1 (SUTURE) IMPLANT
SUT MERSILENE 4 0 P 3 (SUTURE) IMPLANT
SUT POLY BUTTON 15MM (SUTURE) IMPLANT
SUT PROLENE 2 0 SH DA (SUTURE) IMPLANT
SUT SILK 2 0 FS (SUTURE) IMPLANT
SUT SILK 4 0 PS 2 (SUTURE) IMPLANT
SUT STEEL 3 0 (SUTURE) IMPLANT
SUT STEEL 4 0 V 26 (SUTURE) IMPLANT
SUT VIC AB 3-0 PS1 18 (SUTURE)
SUT VIC AB 3-0 PS1 18XBRD (SUTURE) IMPLANT
SUT VIC AB 4-0 P-3 18XBRD (SUTURE) IMPLANT
SUT VIC AB 4-0 P3 18 (SUTURE)
SUT VICRYL 4-0 PS2 18IN ABS (SUTURE) IMPLANT
SUT VICRYL RAPID 5 0 P 3 (SUTURE) IMPLANT
SUT VICRYL RAPIDE 4/0 PS 2 (SUTURE) ×2 IMPLANT
SUTURE FIBERWR 2-0 18 17.9 3/8 (SUTURE) IMPLANT
SUTURE FIBERWR 4-0 18 TAPR NDL (SUTURE) ×1 IMPLANT
SYR BULB 3OZ (MISCELLANEOUS) ×2 IMPLANT
SYR CONTROL 10ML LL (SYRINGE) IMPLANT
TOWEL OR 17X24 6PK STRL BLUE (TOWEL DISPOSABLE) ×4 IMPLANT
TUBE FEEDING 5FR 15 INCH (TUBING) IMPLANT
UNDERPAD 30X30 INCONTINENT (UNDERPADS AND DIAPERS) ×2 IMPLANT

## 2014-09-01 NOTE — Brief Op Note (Signed)
09/01/2014  11:50 AM  PATIENT:  Bernard Hill  58 y.o. male  PRE-OPERATIVE DIAGNOSIS:  Rupture Flexor Digitorum Superficialis Right Middle Finger  POST-OPERATIVE DIAGNOSIS:  Rupture Flexor Digitorum Superficialis Right Middle Finger  PROCEDURE:  Procedure(s): EXPLORATION REPAIR FLEXOR SUPERFICIAL TENDON RIGHT MIDDLE FINGER   (Right)  SURGEON:  Surgeon(s) and Role:    * Daryll Brod, MD - Primary    * Leanora Cover, MD - Assisting  PHYSICIAN ASSISTANT:   ASSISTANTS: K Thurza Kwiecinski,MD   ANESTHESIA:   regional and general  EBL:  Total I/O In: 900 [I.V.:900] Out: -   BLOOD ADMINISTERED:none  DRAINS: none   LOCAL MEDICATIONS USED:  NONE  SPECIMEN:  No Specimen  DISPOSITION OF SPECIMEN:  N/A  COUNTS:  YES  TOURNIQUET:  * Missing tourniquet times found for documented tourniquets in log:  [id number] *  DICTATION: .Other Dictation: Dictation Number [id number]  PLAN OF CARE: Discharge to home after PACU  PATIENT DISPOSITION:  PACU - hemodynamically stable.

## 2014-09-01 NOTE — Anesthesia Preprocedure Evaluation (Signed)
Anesthesia Evaluation  Patient identified by MRN, date of birth, ID band Patient awake    Reviewed: Allergy & Precautions, H&P , NPO status , Patient's Chart, lab work & pertinent test results  Airway Mallampati: II  Neck ROM: full    Dental   Pulmonary former smoker,          Cardiovascular negative cardio ROS      Neuro/Psych  Neuromuscular disease    GI/Hepatic GERD-  ,  Endo/Other  Morbid obesity  Renal/GU      Musculoskeletal  (+) Arthritis -,   Abdominal   Peds  Hematology   Anesthesia Other Findings   Reproductive/Obstetrics                           Anesthesia Physical Anesthesia Plan  ASA: II  Anesthesia Plan: General and Regional   Post-op Pain Management:    Induction: Intravenous  Airway Management Planned: LMA  Additional Equipment:   Intra-op Plan:   Post-operative Plan:   Informed Consent: I have reviewed the patients History and Physical, chart, labs and discussed the procedure including the risks, benefits and alternatives for the proposed anesthesia with the patient or authorized representative who has indicated his/her understanding and acceptance.     Plan Discussed with: CRNA, Anesthesiologist and Surgeon  Anesthesia Plan Comments:         Anesthesia Quick Evaluation

## 2014-09-01 NOTE — Anesthesia Procedure Notes (Signed)
Anesthesia Regional Block:  Supraclavicular block  Pre-Anesthetic Checklist: ,, timeout performed, Correct Patient, Correct Site, Correct Laterality, Correct Procedure, Correct Position, site marked, Risks and benefits discussed,  Surgical consent,  Pre-op evaluation,  At surgeon's request and post-op pain management  Laterality: Right  Prep: chloraprep       Needles:  Injection technique: Single-shot  Needle Type: Echogenic Stimulator Needle     Needle Length: 5cm 5 cm Needle Gauge: 22 and 22 G    Additional Needles:  Procedures: ultrasound guided (picture in chart) and nerve stimulator Supraclavicular block  Nerve Stimulator or Paresthesia:  Response: biceps flexion, 0.45 mA,   Additional Responses:   Narrative:  Start time: 09/01/2014 10:40 AM End time: 09/01/2014 10:52 AM Injection made incrementally with aspirations every 5 mL.  Performed by: Personally  Anesthesiologist: Dr Marcie Bal  Additional Notes: Functioning IV was confirmed and monitors were applied.  A 20mm 22ga Arrow echogenic stimulator needle was used. Sterile prep and drape,hand hygiene and sterile gloves were used.  Negative aspiration and negative test dose prior to incremental administration of local anesthetic. The patient tolerated the procedure well.  Ultrasound guidance: relevent anatomy identified, needle position confirmed, local anesthetic spread visualized around nerve(s), vascular puncture avoided.  Image printed for medical record.

## 2014-09-01 NOTE — Op Note (Signed)
Other Dictation: Dictation Number [id number]

## 2014-09-01 NOTE — Discharge Instructions (Addendum)
Post Anesthesia Home Care Instructions  Activity: Get plenty of rest for the remainder of the day. A responsible adult should stay with you for 24 hours following the procedure.  For the next 24 hours, DO NOT: -Drive a car -Paediatric nurse -Drink alcoholic beverages -Take any medication unless instructed by your physician -Make any legal decisions or sign important papers.  Meals: Start with liquid foods such as gelatin or soup. Progress to regular foods as tolerated. Avoid greasy, spicy, heavy foods. If nausea and/or vomiting occur, drink only clear liquids until the nausea and/or vomiting subsides. Call your physician if vomiting continues.  Special Instructions/Symptoms: Your throat may feel dry or sore from the anesthesia or the breathing tube placed in your throat during surgery. If this causes discomfort, gargle with warm salt water. The discomfort should disappear within 24 hours.  [facility] Instructions Hand Surgery  Wound Care: Keep your hand elevated above the level of your heart.  Do not allow it to dangle by your side.  Keep the dressing dry and do not remove it unless your doctor advises you to do so.  He will usually change it at the time of your post-op visit.  Moving your fingers is advised to stimulate circulation but will depend on the site of your surgery.  If you have a splint applied, your doctor will advise you regarding movement.  Activity: Do not drive or operate machinery today.  Rest today and then you may return to your normal activity and work as indicated by your physician.  Diet:  Drink liquids today or eat a light diet.  You may resume a regular diet tomorrow.    General expectations: Pain for two to three days. Fingers may become slightly swollen.  Call your doctor if any of the following occur: Severe pain not relieved by pain medication. Elevated temperature. Dressing soaked with blood. Inability to move fingers. White or bluish color to  fingers.  Post Anesthesia Home Care Instructions  Activity: Get plenty of rest for the remainder of the day. A responsible adult should stay with you for 24 hours following the procedure.  For the next 24 hours, DO NOT: -Drive a car -Paediatric nurse -Drink alcoholic beverages -Take any medication unless instructed by your physician -Make any legal decisions or sign important papers.  Meals: Start with liquid foods such as gelatin or soup. Progress to regular foods as tolerated. Avoid greasy, spicy, heavy foods. If nausea and/or vomiting occur, drink only clear liquids until the nausea and/or vomiting subsides. Call your physician if vomiting continues.  Special Instructions/Symptoms: Your throat may feel dry or sore from the anesthesia or the breathing tube placed in your throat during surgery. If this causes discomfort, gargle with warm salt water. The discomfort should disappear within 24 hours.   Regional Anesthesia Blocks  1. Numbness or the inability to move the "blocked" extremity may last from 3-48 hours after placement. The length of time depends on the medication injected and your individual response to the medication. If the numbness is not going away after 48 hours, call your surgeon.  2. The extremity that is blocked will need to be protected until the numbness is gone and the  Strength has returned. Because you cannot feel it, you will need to take extra care to avoid injury. Because it may be weak, you may have difficulty moving it or using it. You may not know what position it is in without looking at it while the block is in  effect.  3. For blocks in the legs and feet, returning to weight bearing and walking needs to be done carefully. You will need to wait until the numbness is entirely gone and the strength has returned. You should be able to move your leg and foot normally before you try and bear weight or walk. You will need someone to be with you when you first try to  ensure you do not fall and possibly risk injury.  4. Bruising and tenderness at the needle site are common side effects and will resolve in a few days.  5. Persistent numbness or new problems with movement should be communicated to the surgeon or the [facility] [phone] [place] [phone]).

## 2014-09-01 NOTE — Op Note (Signed)
NAMEAMAAR, OSHITA               ACCOUNT NO.:  [id number]  MEDICAL RECORD NO.:  [id number]  LOCATION:                                 FACILITY:  PHYSICIAN:  Daryll Brod, M.D.            DATE OF BIRTH:  DATE OF PROCEDURE:  09/01/2014 DATE OF DISCHARGE:                              OPERATIVE REPORT   PREOPERATIVE DIAGNOSIS:  Ruptured superficialis tendon, right middle finger.  POSTOPERATIVE DIAGNOSIS:  Ruptured superficialis tendon, right middle finger.  OPERATION:  Exploration with repair of superficialis to profundus, right middle finger.  SURGEON:  Daryll Brod, M.D.  ASSISTANT:  Leanora Cover, M.D.  ANESTHESIA:  Supraclavicular block general.  ANESTHESIOLOGIST:  Albertha Ghee, M.D.  HISTORY:  The patient is a 58 year old male with a history of repair of flexor tendons 20 years ago.  He was working approximately 10 days ago, when he felt a pop with the inability to flex using his superficialis profundus, it appears intact.  MRI reveals a rupture of the superficialis.  He is admitted now for exploration, repair, excision, graft as dictated by findings.  He is aware there is no guarantee with the surgery; possibility of infection; recurrence of injury to arteries, nerves, tendons; incomplete relief of symptoms; dystrophy.  In the preoperative area, the patient was seen, the extremity marked by both the patient and surgeon.  Antibiotic given.  PROCEDURE IN DETAIL:  The patient was brought to the operating room.  A supraclavicular block general anesthetic was carried out without difficulty under the direction of Dr. Marcie Bal.  He was prepped using ChloraPrep in supine position with the right arm free.  A 3-minute dry time was allowed.  Time-out taken, confirming the patient and procedure. A volar Bruner incision was made after exsanguination of the limb with an Esmarch bandage and inflation of tourniquet to 250 mmHg.  This was done over the metacarpophalangeal joint area of  the right middle finger, carried down through subcutaneous tissue.  Bleeders were electrocauterized.  The neurovascular bundles were identified.  The flexor tendon superficialis was found to be ruptured.  This appeared to have healed to the sheath and to the profundus tendon.  It had ruptured off the profundus tendon, leaving a very raw partial injury to the profundus tendon.  The area was debrided distally to the A1 pulley, the finger flexed.  No distal portion of the superficialis tendon was observed.  It was decided to proceed with a debridement of scar about the superficialis and profundus, and repair of the superficialis to the profundus was then performed, suturing this with a abductor type weave on either side of the superficialis profundus and then across the profundus distally.  This was done with 4-0 FiberWire.  This firmly fixed the superficialis to the profundus tendon.  This was done at the level of the lumbrical.  The wound was copiously irrigated with saline. The skin was then closed with interrupted 4-0 Vicryl Rapide sutures.  Sterile compressive dressing was applied.  On deflation of the tourniquet, all fingers immediately pinked.  He was taken to the recovery room for observation in satisfactory condition.  He has methadone to take at  home.  He will return in 1 week.          ______________________________ Daryll Brod, M.D.     GK/MEDQ  D:  09/01/2014  T:  09/01/2014  Job:  [id number]

## 2014-09-01 NOTE — Anesthesia Postprocedure Evaluation (Signed)
Anesthesia Post Note  Patient: Bernard Hill  Procedure(s) Performed: Procedure(s) (LRB): EXPLORATION REPAIR FLEXOR SUPERFICIAL TENDON RIGHT MIDDLE FINGER   (Right)  Anesthesia type: General  Patient location: PACU  Post pain: Pain level controlled and Adequate analgesia  Post assessment: Post-op Vital signs reviewed, Patient's Cardiovascular Status Stable, Respiratory Function Stable, Patent Airway and Pain level controlled  Last Vitals:  Filed Vitals:   09/01/14 1314  BP: 142/66  Pulse: 70  Temp: 36.5 C  Resp: 16    Post vital signs: Reviewed and stable  Level of consciousness: awake, alert  and oriented  Complications: No apparent anesthesia complications

## 2014-09-01 NOTE — H&P (Signed)
Bernard Hill is a 58 year-old right-hand dominant male who suffered an injury to his right hand. He complains of pain to his right long finger.  He was using a pair of pliers when he felt a pop in the mid palm. The injury occurred on 8/22.  He saw Bernard Hill on 8/23.  Bernard Hill called me and I explained to place him in a splint and have him come see me.  He is seen, he is complaining of pain in mid palm just proximal to the metacarpophalangeal joint of his middle finger right hand.  He has prior history of lacerations to the index and middle fingers in 1978 with repair of leaders.  He was told that he would not be able to play the piano, that he would not be able to regain full mobility.  This was done in [facility].  He does not remember who the physician was that did it.  He complains of a severe aching swelling pain, he has been using ice.  He has no history of diabetes, thyroid problems, arthritis or gout.He has had his MRI done revealing a rupture of the superficialis tendon. This had been repaired along with profundus in the past  ALLERGIES:   None.  MEDICATIONS:    Methadone, Flomax, Cialis.    SURGICAL HISTORY:    He has had three back surgeries, hand surgery.  FAMILY MEDICAL HISTORY:    Positive for high blood pressure, otherwise negative.   SOCIAL HISTORY:    He does not smoke or drink.  He is married, retired.    REVIEW OF SYSTEMS:     Negative 14 points Bernard Hill is an 58 y.o. male.   Chief Complaint: Ruptured flexor tendon right middle finger HPI: see above  Past Medical History  Diagnosis Date  . Chronic back pain   . BPH (benign prostatic hypertrophy)   . Arthritis     Past Surgical History  Procedure Laterality Date  . Lumbar fusion  90,94,96    x3  . Finger amputation      rt middle ,rt index  . Colonoscopy      History reviewed. No pertinent family history. Social History:  reports that he quit smoking about 35 years ago. He does not have any smokeless tobacco  history on file. He reports that he does not drink alcohol or use illicit drugs.  Allergies: No Known Allergies  No prescriptions prior to admission    No results found for this or any previous visit (from the past 48 hour(s)).  No results found.   Pertinent items are noted in HPI.  Height 5\' 9"  (1.753 m), weight 113.399 kg (250 lb).  General appearance: alert, cooperative and appears stated age Head: Normocephalic, without obvious abnormality Neck: no JVD Resp: clear to auscultation bilaterally Cardio: regular rate and rhythm, S1, S2 normal, no murmur, click, rub or gallop GI: soft, non-tender; bowel sounds normal; no masses,  no organomegaly Extremities: pain MCP right middle finger, no FDS function Pulses: 2+ and symmetric Skin: Skin color, texture, turgor normal. No rashes or lesions Neurologic: Grossly normal Incision/Wound: na  Assessment/Plan RADIOGRAPHS:   X-rays are negative.  DIAGNOSIS:   rupture of repaired flexor tendons to the middle finger.  We have had a long discussion with respect to this. He has pain at the MCP joint and pain with resisted flexion. He does not have any superficialis function. We have discussed the possibility of observation of this versus exploration with either  excision of the superficialis repaired depending on the point of rupture but more importantly to inspect the profundus tendon which was also repaired. He is advised of the potential for grafting, repair or excision of the superficialis. Possible grafting of the profundus. He desires proceeding to have this done. This is scheduled as an outpatient to his right middle finger. The pre, peri and post op course are discussed along with risks and complications.  He is aware there is no guarantee with surgery, possibility of infection, recurrence, injury to arteries, nerves and tendons, incomplete relief of symptoms, loss of mobility and dystrophy.  It is noted that Bernard Hill has a palmaris longus  which is available for grafting.  Bernard Hill R 09/01/2014, 7:47 AM

## 2014-09-01 NOTE — Progress Notes (Signed)
Assisted Dr. Hodierne with right, ultrasound guided, interscalene  block. Side rails up, monitors on throughout procedure. See vital signs in flow sheet. Tolerated Procedure well. 

## 2014-09-01 NOTE — Transfer of Care (Signed)
Immediate Anesthesia Transfer of Care Note  Patient: Bernard Hill  Procedure(s) Performed: Procedure(s): EXPLORATION REPAIR FLEXOR SUPERFICIAL TENDON RIGHT MIDDLE FINGER   (Right)  Patient Location: PACU  Anesthesia Type:GA combined with regional for post-op pain  Level of Consciousness: awake, alert  and oriented  Airway & Oxygen Therapy: Patient Spontanous Breathing and Patient connected to face mask oxygen  Post-op Assessment: Report given to PACU RN and Post -op Vital signs reviewed and stable  Post vital signs: Reviewed and stable  Complications: No apparent anesthesia complications

## 2014-09-02 ENCOUNTER — Encounter (HOSPITAL_BASED_OUTPATIENT_CLINIC_OR_DEPARTMENT_OTHER): Payer: Self-pay | Admitting: Orthopedic Surgery

## 2015-02-22 ENCOUNTER — Other Ambulatory Visit (HOSPITAL_COMMUNITY): Payer: Self-pay | Admitting: Anesthesiology

## 2015-02-22 DIAGNOSIS — M25562 Pain in left knee: Secondary | ICD-10-CM

## 2015-02-25 ENCOUNTER — Ambulatory Visit (HOSPITAL_COMMUNITY)
Admission: RE | Admit: 2015-02-25 | Discharge: 2015-02-25 | Disposition: A | Discharge status: Home / Self Care | Payer: Medicare Other | Source: Ambulatory Visit | Attending: Anesthesiology | Admitting: Anesthesiology

## 2015-02-25 ENCOUNTER — Other Ambulatory Visit (HOSPITAL_COMMUNITY): Payer: Self-pay | Admitting: Anesthesiology

## 2015-02-25 DIAGNOSIS — M25562 Pain in left knee: Secondary | ICD-10-CM | POA: Insufficient documentation

## 2015-05-22 ENCOUNTER — Encounter (HOSPITAL_COMMUNITY): Payer: Self-pay

## 2015-05-22 ENCOUNTER — Emergency Department (HOSPITAL_COMMUNITY): Payer: Medicare Other

## 2015-05-22 ENCOUNTER — Emergency Department (HOSPITAL_COMMUNITY)
Admission: EM | Admit: 2015-05-22 | Discharge: 2015-05-22 | Disposition: A | Discharge status: Home / Self Care | Payer: Medicare Other | Attending: Emergency Medicine | Admitting: Emergency Medicine

## 2015-05-22 DIAGNOSIS — R109 Unspecified abdominal pain: Secondary | ICD-10-CM | POA: Insufficient documentation

## 2015-05-22 DIAGNOSIS — Z87891 Personal history of nicotine dependence: Secondary | ICD-10-CM | POA: Insufficient documentation

## 2015-05-22 DIAGNOSIS — G8929 Other chronic pain: Secondary | ICD-10-CM | POA: Diagnosis not present

## 2015-05-22 DIAGNOSIS — M1990 Unspecified osteoarthritis, unspecified site: Secondary | ICD-10-CM | POA: Diagnosis not present

## 2015-05-22 DIAGNOSIS — M545 Low back pain: Secondary | ICD-10-CM | POA: Insufficient documentation

## 2015-05-22 DIAGNOSIS — Z981 Arthrodesis status: Secondary | ICD-10-CM | POA: Diagnosis not present

## 2015-05-22 DIAGNOSIS — N400 Enlarged prostate without lower urinary tract symptoms: Secondary | ICD-10-CM | POA: Insufficient documentation

## 2015-05-22 LAB — URINALYSIS, ROUTINE W REFLEX MICROSCOPIC
BILIRUBIN URINE: NEGATIVE
Glucose, UA: NEGATIVE mg/dL
Hgb urine dipstick: NEGATIVE
KETONES UR: NEGATIVE mg/dL
Leukocytes, UA: NEGATIVE
NITRITE: NEGATIVE
Protein, ur: NEGATIVE mg/dL
Specific Gravity, Urine: 1.025 (ref 1.005–1.030)
Urobilinogen, UA: 0.2 mg/dL (ref 0.0–1.0)
pH: 5.5 (ref 5.0–8.0)

## 2015-05-22 MED ORDER — ONDANSETRON HCL 4 MG/2ML IJ SOLN
4.0000 mg | Freq: Once | INTRAMUSCULAR | Status: AC
  Administered 2015-05-22: 4 mg via INTRAVENOUS
  Filled 2015-05-22: qty 2

## 2015-05-22 MED ORDER — METHOCARBAMOL 500 MG PO TABS
1000.0000 mg | ORAL_TABLET | Freq: Once | ORAL | Status: AC
  Administered 2015-05-22: 1000 mg via ORAL
  Filled 2015-05-22: qty 2

## 2015-05-22 MED ORDER — KETOROLAC TROMETHAMINE 30 MG/ML IJ SOLN
30.0000 mg | Freq: Once | INTRAMUSCULAR | Status: AC
  Administered 2015-05-22: 30 mg via INTRAVENOUS
  Filled 2015-05-22: qty 1

## 2015-05-22 MED ORDER — DEXAMETHASONE 6 MG PO TABS: ORAL_TABLET | ORAL | Status: DC

## 2015-05-22 MED ORDER — MORPHINE SULFATE 4 MG/ML IJ SOLN
4.0000 mg | Freq: Once | INTRAMUSCULAR | Status: AC
  Administered 2015-05-22: 4 mg via INTRAVENOUS
  Filled 2015-05-22: qty 1

## 2015-05-22 MED ORDER — METHOCARBAMOL 500 MG PO TABS: 1000.0000 mg | ORAL_TABLET | Freq: Three times a day (TID) | ORAL | Status: DC

## 2015-05-22 NOTE — ED Provider Notes (Signed)
CSN: [id number]     Arrival date & time 05/22/15  0908 History   First MD Initiated Contact with Patient 05/22/15 0920     Chief Complaint  Patient presents with  . Flank Pain     (Consider location/radiation/quality/duration/timing/severity/associated sxs/prior Treatment) HPI Comments: Right flank pain.  Patient is a 59 y.o. male presenting with flank pain. The history is provided by the patient and the spouse.  Flank Pain This is a new problem. The current episode started 1 to 4 weeks ago. The problem occurs intermittently. The problem has been gradually worsening. Associated symptoms include arthralgias. Pertinent negatives include no abdominal pain, chest pain, coughing, fever, neck pain or vomiting. Nothing aggravates the symptoms. Treatments tried: current medications. The treatment provided no relief.    Past Medical History  Diagnosis Date  . Chronic back pain   . BPH (benign prostatic hypertrophy)   . Arthritis    Past Surgical History  Procedure Laterality Date  . Lumbar fusion  90,94,96    x3  . Finger amputation      rt middle ,rt index  . Colonoscopy    . Repair extensor tendon Right 09/01/2014    Procedure: EXPLORATION REPAIR FLEXOR SUPERFICIAL TENDON RIGHT MIDDLE FINGER  ;  Surgeon: Daryll Brod, MD;  Location: [facility];  Service: Orthopedics;  Laterality: Right;   No family history on file. History  Substance Use Topics  . Smoking status: Former Smoker    Quit date: 08/29/1979  . Smokeless tobacco: Not on file  . Alcohol Use: No    Review of Systems  Constitutional: Negative for fever and activity change.       All ROS Neg except as noted in HPI  HENT: Negative for nosebleeds.   Eyes: Negative for photophobia and discharge.  Respiratory: Negative for cough, shortness of breath and wheezing.   Cardiovascular: Negative for chest pain and palpitations.  Gastrointestinal: Negative for vomiting, abdominal pain and blood in stool.   Genitourinary: Positive for flank pain. Negative for dysuria, frequency and hematuria.  Musculoskeletal: Positive for back pain and arthralgias. Negative for neck pain.  Skin: Negative.   Neurological: Negative for dizziness, seizures and speech difficulty.  Psychiatric/Behavioral: Negative for hallucinations and confusion.      Allergies  Review of patient's allergies indicates no known allergies.  Home Medications   Prior to Admission medications   Medication Sig Start Date End Date Taking? Authorizing Provider  ANDROGEL PUMP 20.25 MG/ACT (1.62%) GEL Place 3 application onto the skin daily. 04/29/15  Yes Historical Provider, MD  HYDROcodone-acetaminophen (NORCO) 7.5-325 MG per tablet Take 1 tablet by mouth every 4 (four) hours as needed. pain 05/04/15  Yes Historical Provider, MD  methadone (DOLOPHINE) 5 MG tablet Take 5-10 mg by mouth every 12 (twelve) hours. Patient takes 1 tablet in the morning and 2 tablets at bedtime   Yes Historical Provider, MD  tamsulosin (FLOMAX) 0.4 MG CAPS capsule Take 0.4 mg by mouth at bedtime. 04/13/15  Yes Historical Provider, MD  HYDROcodone-acetaminophen (NORCO) 5-325 MG per tablet Take 1 tablet by mouth every 6 (six) hours as needed for moderate pain. Patient not taking: Reported on 05/22/2015 09/01/14   Daryll Brod, MD  tiZANidine (ZANAFLEX) 4 MG tablet Take 4 mg by mouth every 8 (eight) hours as needed. Muscle spasm 05/17/15   Historical Provider, MD   BP 154/87 mmHg  Pulse 91  Temp(Src) 97.6 F (36.4 C) (Oral)  Resp 18  Ht 5\' 9"  (1.753 m)  Wt  270 lb (122.471 kg)  BMI 39.85 kg/m2  SpO2 96% Physical Exam  Constitutional: He is oriented to person, place, and time. He appears well-developed and well-nourished.  Non-toxic appearance.  HENT:  Head: Normocephalic.  Right Ear: Tympanic membrane and external ear normal.  Left Ear: Tympanic membrane and external ear normal.  Eyes: EOM and lids are normal. Pupils are equal, round, and reactive to light.   Neck: Normal range of motion. Neck supple. Carotid bruit is not present.  Cardiovascular: Normal rate, regular rhythm, normal heart sounds, intact distal pulses and normal pulses.   Pulmonary/Chest: Breath sounds normal. No respiratory distress.  Abdominal: Soft. Bowel sounds are normal. There is no tenderness. There is no guarding.  No CVAT  Musculoskeletal: Normal range of motion.  Pain with attempted ROM of the lumbar area. No not hot areas. Right flank and lower back area pain.  Lymphadenopathy:       Head (right side): No submandibular adenopathy present.       Head (left side): No submandibular adenopathy present.    He has no cervical adenopathy.  Neurological: He is alert and oriented to person, place, and time. He has normal strength. No cranial nerve deficit or sensory deficit.  Skin: Skin is warm and dry.  Psychiatric: He has a normal mood and affect. His speech is normal.  Nursing note and vitals reviewed.   ED Course  Procedures (including critical care time) Labs Review Labs Reviewed  URINALYSIS, ROUTINE W REFLEX MICROSCOPIC (NOT AT [facility])    Imaging Review Ct Renal Stone Study  05/22/2015   CLINICAL DATA:  Pt c/o pain in r flank area x 10 days. Denies pain with urination but says sometimes he doesn't void a lot at one time. Reports history of enlarged prostate and kidney stones.  EXAM: CT ABDOMEN AND PELVIS WITHOUT CONTRAST  TECHNIQUE: Multidetector CT imaging of the abdomen and pelvis was performed following the standard protocol without IV contrast.  COMPARISON:  None.  FINDINGS: Lung bases:  Minor subsegmental atelectasis.  Heart normal size.  Liver:  Fatty infiltration.  No mass or focal lesion.  Spleen, gallbladder, pancreas, adrenal glands:  Unremarkable.  Kidneys, ureters and bladder: No renal stones. No renal masses. Mild cortical thinning. No hydronephrosis. Ureters normal course and caliber. Bladder is unremarkable.  Lymph nodes:  No enlarged or abnormal lymph  nodes.  Ascites:  None.  Bowel:  Unremarkable.  Appendix:  Normal.  Musculoskeletal: Intervertebral cages noted at L2-L3 and L3-L4. Degenerative changes noted throughout the visualized spine. No osteoblastic or osteolytic lesions.  IMPRESSION: 1. No acute findings. No findings to explain the patient's symptoms of right flank pain. No renal or ureteral stones or obstructive uropathy. Appendix normal. 2. Hepatic steatosis. 3. Postsurgical and degenerative changes noted of the visualized spine.   Electronically Signed   By: Lajean Manes M.D.   On: 05/22/2015 11:27     EKG Interpretation None      MDM  Vital signs are well within normal limits. Pulse oximetry is 96% on room air. No gross neurologic deficits appreciated. The patient's flank pain is sometimes increased with certain movement on examination. A CT scan of the abdomen and pelvis shows no acute findings. No kidney stones or ureteral stones noted. The appendix was normal. There are postsurgical and degenerative changes of visualized about the spine. Suspect that this pain must be musculoskeletal at this time.  The patient is advised to continue his current medications. Will add Decadron and Robaxin to current  medications. Patient to follow-up with his primary physician for additional evaluation and management.    Final diagnoses:  Right flank pain    *I have reviewed nursing notes, vital signs, and all appropriate lab and imaging results for this patient.Lily Kocher, PA-C 05/24/15 [place], DO 05/27/15 1952

## 2015-05-22 NOTE — ED Notes (Signed)
Pt c/o pain in r flank area x 10 days.  Denies pain with urination but says sometimes he doesn't void a lot at one time.  Reports history of enlarged prostate and kidney stones.

## 2015-05-22 NOTE — Discharge Instructions (Signed)
Please continue your hydrocodone and your methadone. Please add decadron and robaxin. Heating pad may be helpful. Please see your MD next week for recheck. Back Pain, Adult Low back pain is very common. About 1 in 5 people have back pain.The cause of low back pain is rarely dangerous. The pain often gets better over time.About half of people with a sudden onset of back pain feel better in just 2 weeks. About 8 in 10 people feel better by 6 weeks.  CAUSES Some common causes of back pain include:  Strain of the muscles or ligaments supporting the spine.  Wear and tear (degeneration) of the spinal discs.  Arthritis.  Direct injury to the back. DIAGNOSIS Most of the time, the direct cause of low back pain is not known.However, back pain can be treated effectively even when the exact cause of the pain is unknown.Answering your caregiver's questions about your overall health and symptoms is one of the most accurate ways to make sure the cause of your pain is not dangerous. If your caregiver needs more information, he or she may order lab work or imaging tests (X-rays or MRIs).However, even if imaging tests show changes in your back, this usually does not require surgery. HOME CARE INSTRUCTIONS For many people, back pain returns.Since low back pain is rarely dangerous, it is often a condition that people can learn to [place] their own.   Remain active. It is stressful on the back to sit or stand in one place. Do not sit, drive, or stand in one place for more than 30 minutes at a time. Take short walks on level surfaces as soon as pain allows.Try to increase the length of time you walk each day.  Do not stay in bed.Resting more than 1 or 2 days can delay your recovery.  Do not avoid exercise or work.Your body is made to move.It is not dangerous to be active, even though your back may hurt.Your back will likely heal faster if you return to being active before your pain is gone.  Pay  attention to your body when you bend and lift. Many people have less discomfortwhen lifting if they bend their knees, keep the load close to their bodies,and avoid twisting. Often, the most comfortable positions are those that put less stress on your recovering back.  Find a comfortable position to sleep. Use a firm mattress and lie on your side with your knees slightly bent. If you lie on your back, put a pillow under your knees.  Only take over-the-counter or prescription medicines as directed by your caregiver. Over-the-counter medicines to reduce pain and inflammation are often the most helpful.Your caregiver may prescribe muscle relaxant drugs.These medicines help dull your pain so you can more quickly return to your normal activities and healthy exercise.  Put ice on the injured area.  Put ice in a plastic bag.  Place a towel between your skin and the bag.  Leave the ice on for 15-20 minutes, 03-04 times a day for the first 2 to 3 days. After that, ice and heat may be alternated to reduce pain and spasms.  Ask your caregiver about trying back exercises and gentle massage. This may be of some benefit.  Avoid feeling anxious or stressed.Stress increases muscle tension and can worsen back pain.It is important to recognize when you are anxious or stressed and learn ways to manage it.Exercise is a great option. SEEK MEDICAL CARE IF:  You have pain that is not relieved with rest or  medicine.  You have pain that does not improve in 1 week.  You have new symptoms.  You are generally not feeling well. SEEK IMMEDIATE MEDICAL CARE IF:   You have pain that radiates from your back into your legs.  You develop new bowel or bladder control problems.  You have unusual weakness or numbness in your arms or legs.  You develop nausea or vomiting.  You develop abdominal pain.  You feel faint. Document Released: 12/11/2005 Document Revised: 06/11/2012 Document Reviewed:  04/14/2014 [facility] Patient Information 2015 [place], [place]. This information is not intended to replace advice given to you by your health care provider. Make sure you discuss any questions you have with your health care provider.

## 2015-12-26 HISTORY — PX: HAND SURGERY: SHX662

## 2016-03-24 ENCOUNTER — Emergency Department (HOSPITAL_COMMUNITY)
Admission: EM | Admit: 2016-03-24 | Discharge: 2016-03-24 | Disposition: A | Discharge status: Home / Self Care | Payer: Medicare Other

## 2016-12-12 IMAGING — CT CT RENAL STONE PROTOCOL
2 of 3 series · 17 of 46 positions shown, 19 images · non-contrast
Comparison: None.

CLINICAL DATA: Pt c/o pain in r flank area x 10 days. Denies pain
with urination but says sometimes he doesn't void a lot at one time.
Reports history of enlarged prostate and kidney stones.

EXAM:
CT ABDOMEN AND PELVIS WITHOUT CONTRAST
TECHNIQUE: Multidetector CT imaging of the abdomen and pelvis was performed
following the standard protocol without IV contrast.

[Series 2: standard/full over (age)lbs 5.0 · axial · 0.92mm/px · z∈[+674,+1104]mm · 14 of 100 slices shown, 16 images]
[im 7/100  soft-tissue]
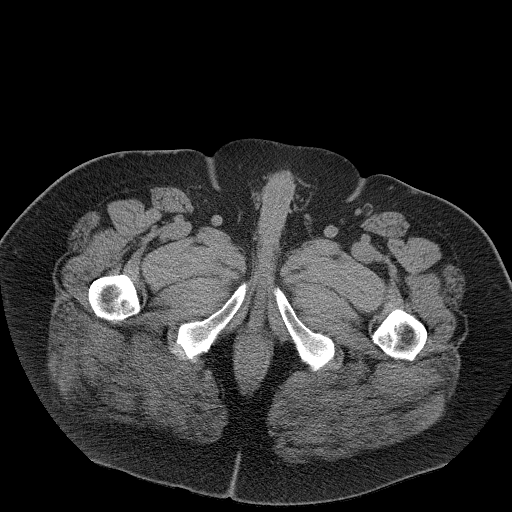
[im 7/100  bone]
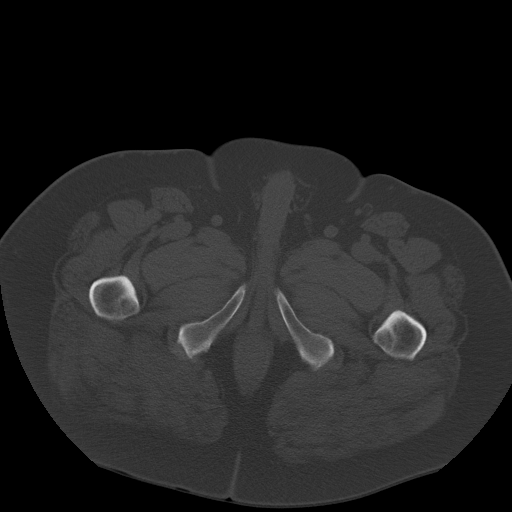
[im 13/100  soft-tissue]
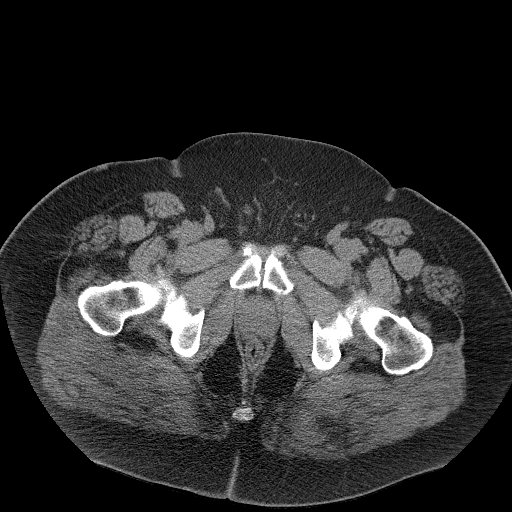
[im 20/100  soft-tissue]
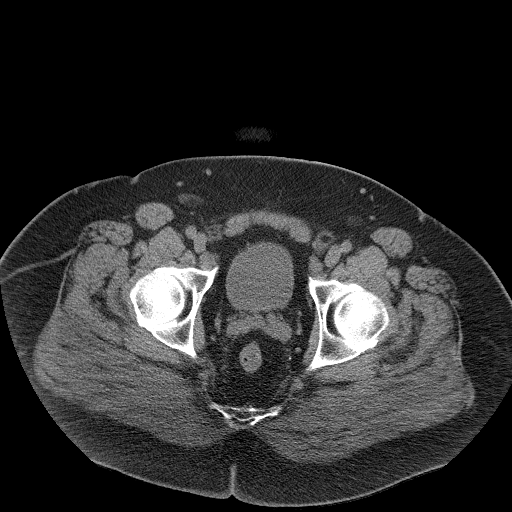
[im 26/100  soft-tissue]
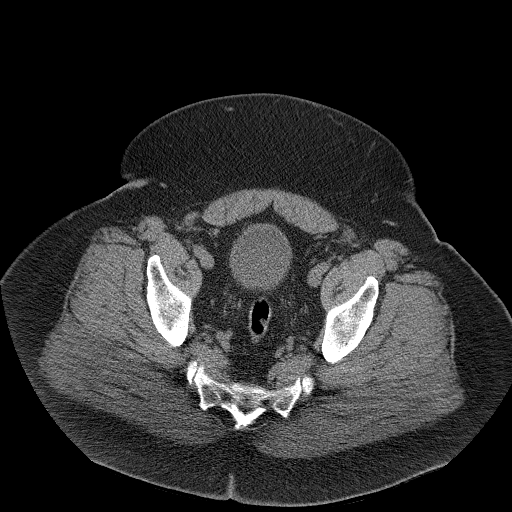
[im 32/100  soft-tissue]
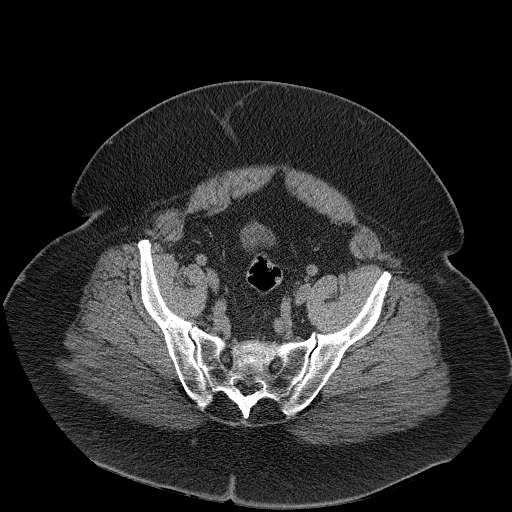
[im 39/100  soft-tissue]
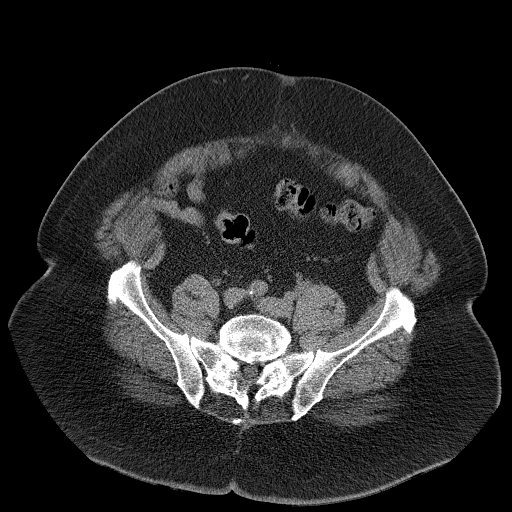
[im 45/100  soft-tissue]
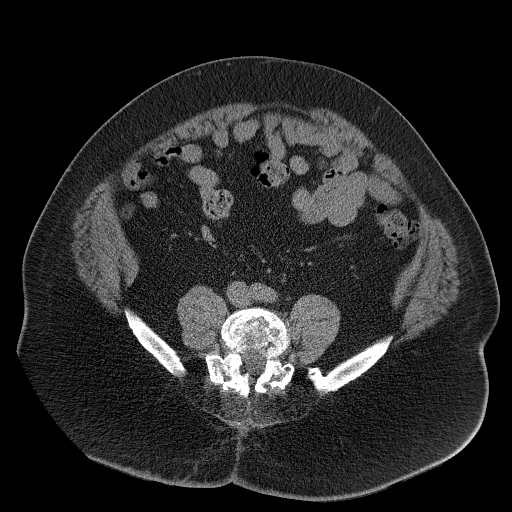
[im 55/100  soft-tissue]
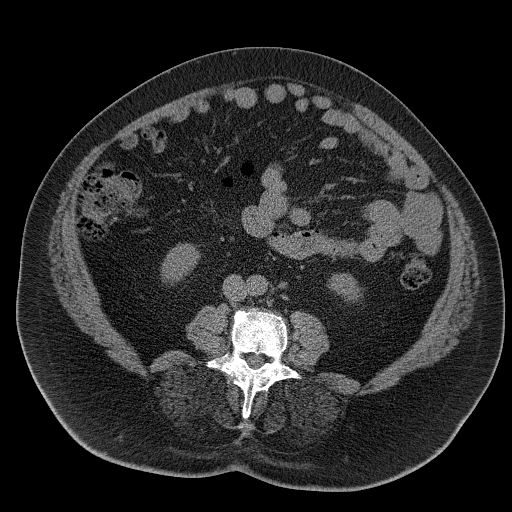
[im 61/100  soft-tissue]
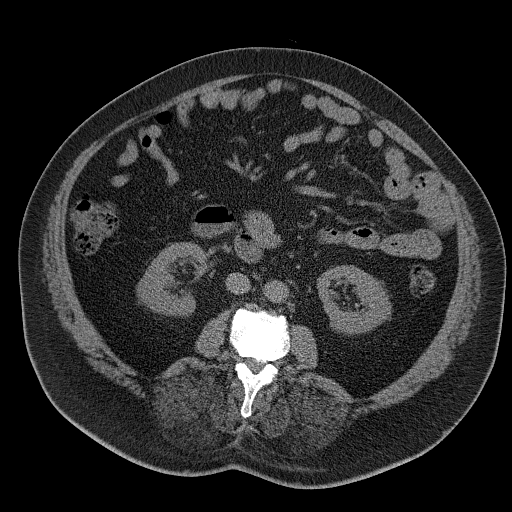
[im 61/100  bone]
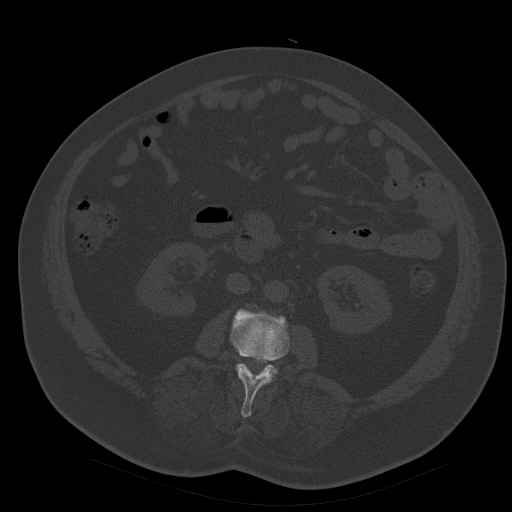
[im 68/100  soft-tissue]
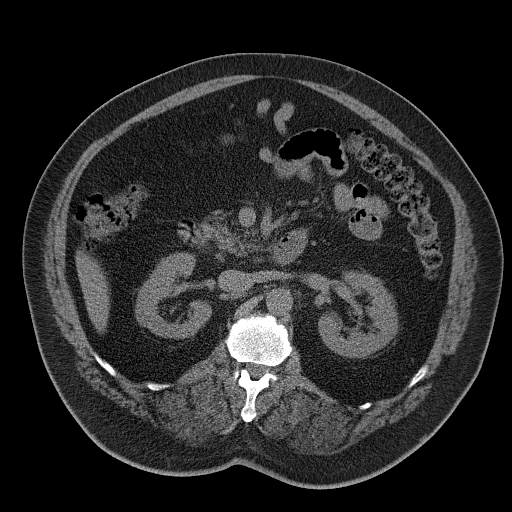
[im 74/100  soft-tissue]
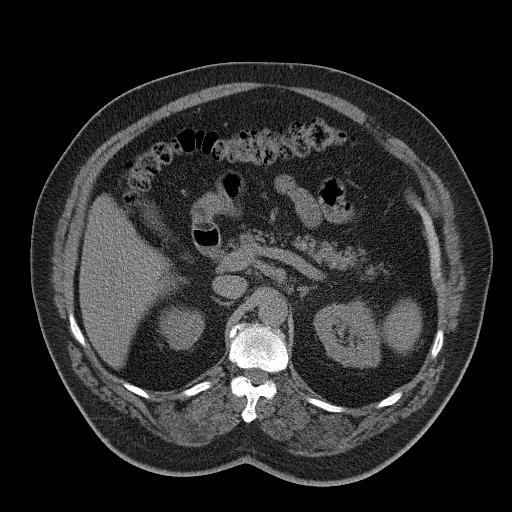
[im 80/100  soft-tissue]
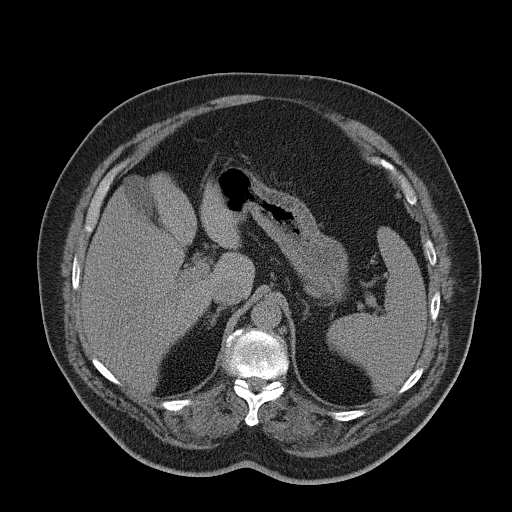
[im 87/100  soft-tissue]
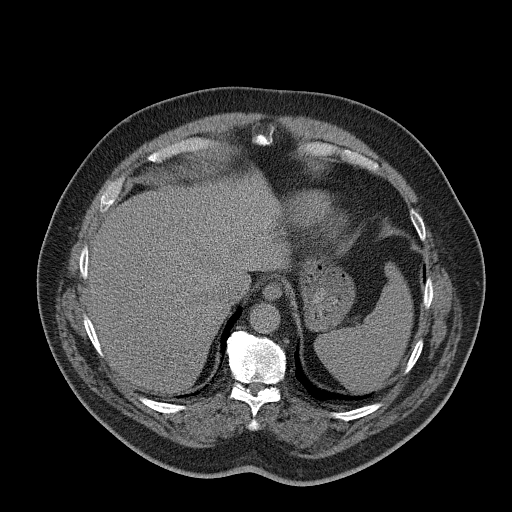
[im 93/100  soft-tissue]
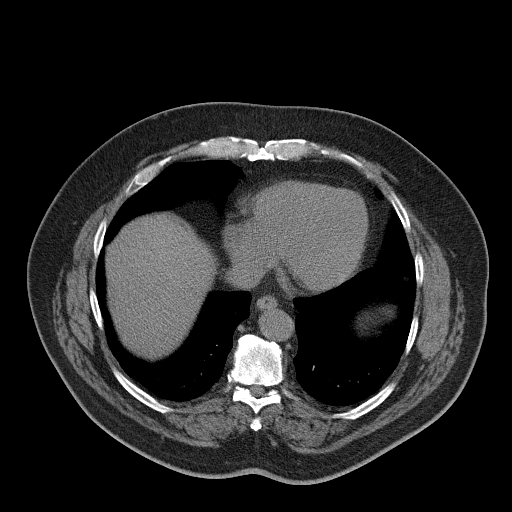

[Series 3: mpr coronal · coronal · 0.93mm/px · 3 of 125 slices shown]
[im 42/125  soft-tissue]
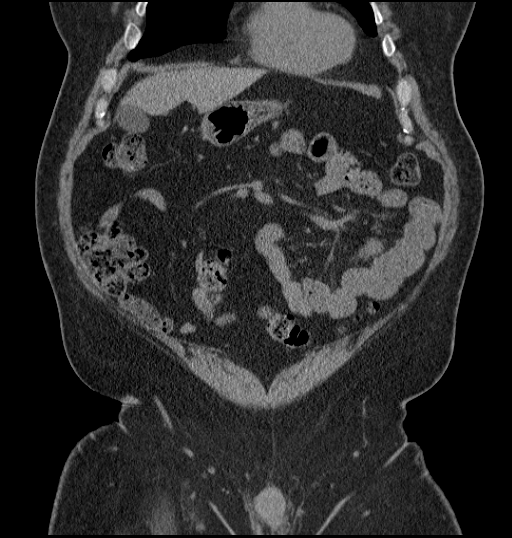
[im 56/125  soft-tissue]
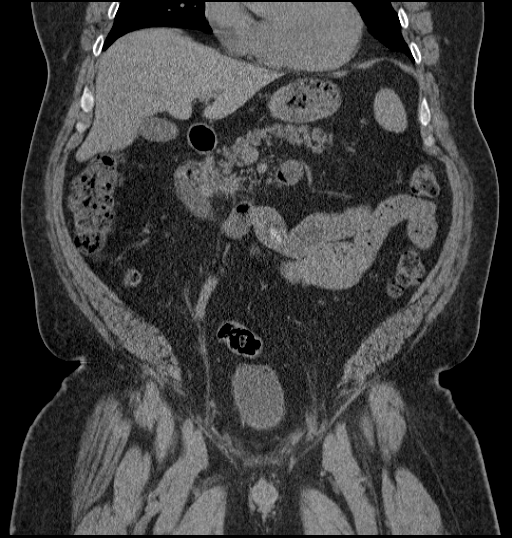
[im 69/125  soft-tissue]
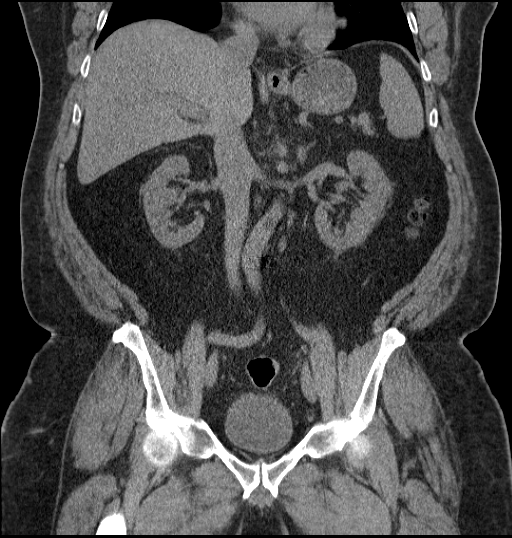

[17 of 46 positions shown; findings below may reference images not displayed]

FINDINGS: Lung bases:  Minor subsegmental atelectasis.  Heart normal size.

Liver:  Fatty infiltration.  No mass or focal lesion.

Spleen, gallbladder, pancreas, adrenal glands:  Unremarkable.

Kidneys, ureters and bladder: No renal stones. No renal masses. Mild
cortical thinning. No hydronephrosis. Ureters normal course and
caliber. Bladder is unremarkable.

Lymph nodes:  No enlarged or abnormal lymph nodes.

Ascites:  None.

Bowel:  Unremarkable.

Appendix:  Normal.

Musculoskeletal: Intervertebral cages noted at L2-L3 and L3-L4.
Degenerative changes noted throughout the visualized spine. No
osteoblastic or osteolytic lesions.
IMPRESSION: 1. No acute findings. No findings to explain the patient's symptoms
of right flank pain. No renal or ureteral stones or obstructive
uropathy. Appendix normal.
2. Hepatic steatosis.
3. Postsurgical and degenerative changes noted of the visualized
spine.

## 2017-01-24 DIAGNOSIS — M1712 Unilateral primary osteoarthritis, left knee: Secondary | ICD-10-CM | POA: Insufficient documentation

## 2017-04-05 DIAGNOSIS — Z6841 Body Mass Index (BMI) 40.0 and over, adult: Secondary | ICD-10-CM | POA: Insufficient documentation

## 2017-06-01 DIAGNOSIS — G894 Chronic pain syndrome: Secondary | ICD-10-CM | POA: Diagnosis not present

## 2017-06-01 DIAGNOSIS — Z79891 Long term (current) use of opiate analgesic: Secondary | ICD-10-CM | POA: Diagnosis not present

## 2017-06-01 DIAGNOSIS — M961 Postlaminectomy syndrome, not elsewhere classified: Secondary | ICD-10-CM | POA: Diagnosis not present

## 2017-06-01 DIAGNOSIS — M25562 Pain in left knee: Secondary | ICD-10-CM | POA: Diagnosis not present

## 2018-03-01 DIAGNOSIS — M25562 Pain in left knee: Secondary | ICD-10-CM | POA: Insufficient documentation

## 2018-03-07 ENCOUNTER — Encounter (INDEPENDENT_AMBULATORY_CARE_PROVIDER_SITE_OTHER): Payer: Self-pay | Admitting: Internal Medicine

## 2018-03-07 ENCOUNTER — Ambulatory Visit (INDEPENDENT_AMBULATORY_CARE_PROVIDER_SITE_OTHER): Payer: Medicare Other | Admitting: Internal Medicine

## 2018-03-07 VITALS — BP 130/82 | HR 72 | Temp 98.1°F | Ht 68.0 in | Wt 286.9 lb

## 2018-03-07 DIAGNOSIS — R195 Other fecal abnormalities: Secondary | ICD-10-CM

## 2018-03-07 NOTE — Patient Instructions (Signed)
The risks of bleeding, perforation and infection were reviewed with patient.  

## 2018-03-07 NOTE — Progress Notes (Addendum)
   Subjective:    Patient ID: Bernard Hill, male    DOB: 29-Jul-1956, 62 y.o.   MRN: [id number]  HPI Referred by Dr. Woody Seller for positive stool card. He tells me his brother died of bladder with mets to the colon.  His last colonoscopy was in 2012.  He says there has not been any change in his stools. Has not seen any blood.  Colonoscopy in 2011 by Dr. Laural Golden (Screening) and was normal . No GI complaints. 01/28/2018 H and H 15.9 and 48.1 Chronic back pain and takes Methadone.  No family hx of colon cancer.  Review of Systems      Past Medical History:  Diagnosis Date  . Arthritis   . BPH (benign prostatic hypertrophy)   . Chronic back pain     Past Surgical History:  Procedure Laterality Date  . COLONOSCOPY    . FINGER AMPUTATION     rt middle ,rt index  . LUMBAR FUSION  90,94,96   x3  . REPAIR EXTENSOR TENDON Right 09/01/2014   Procedure: EXPLORATION REPAIR FLEXOR SUPERFICIAL TENDON RIGHT MIDDLE FINGER  ;  Surgeon: Daryll Brod, MD;  Location: [place];  Service: Orthopedics;  Laterality: Right;    No Known Allergies  Current Outpatient Medications on File Prior to Visit  Medication Sig Dispense Refill  . ANDROGEL PUMP 20.25 MG/ACT (1.62%) GEL Place 3 application onto the skin daily.  5  . methadone (DOLOPHINE) 5 MG tablet Take 5 mg by mouth daily. Patient takes 1 tablet in the morning and 2 tablets at bedtime    . tadalafil (CIALIS) 5 MG tablet Take 5 mg by mouth daily as needed for erectile dysfunction.    Marland Kitchen tiZANidine (ZANAFLEX) 4 MG tablet Take 4 mg by mouth every 8 (eight) hours as needed. Muscle spasm  0   No current facility-administered medications on file prior to visit.       Objective:   Physical Exam Blood pressure 130/82, pulse 72, temperature 98.1 F (36.7 C), height 5\' 8"  (1.727 m), weight 286 lb 14.4 oz (130.1 kg). Alert and oriented. Skin warm and dry. Oral mucosa is moist.   . Sclera anicteric, conjunctivae is pink. Thyroid not enlarged. No  cervical lymphadenopathy. Lungs clear. Heart regular rate and rhythm.  Abdomen is soft. Bowel sounds are positive. No hepatomegaly. No abdominal masses felt. No tenderness.  No edema to lower extremities.           Assessment & Plan:  Guaiac positive stool. Needs colonoscopy to rule out colonic neoplasm, AVM, polyp.

## 2018-03-08 ENCOUNTER — Telehealth (INDEPENDENT_AMBULATORY_CARE_PROVIDER_SITE_OTHER): Payer: Self-pay | Admitting: *Deleted

## 2018-03-08 ENCOUNTER — Encounter (INDEPENDENT_AMBULATORY_CARE_PROVIDER_SITE_OTHER): Payer: Self-pay | Admitting: *Deleted

## 2018-03-08 DIAGNOSIS — R195 Other fecal abnormalities: Secondary | ICD-10-CM | POA: Insufficient documentation

## 2018-03-08 NOTE — Telephone Encounter (Signed)
Patient needs trilyte 

## 2018-03-11 MED ORDER — PEG 3350-KCL-NA BICARB-NACL 420 G PO SOLR: 4000.0000 mL | Freq: Once | ORAL | 0 refills | Status: AC

## 2018-04-09 NOTE — Patient Instructions (Signed)
KALAB CAMPS  04/09/2018     @PREFPERIOPPHARMACY @   Your procedure is scheduled on  04/19/2018 .  Report to [facility] at  1025   A.M.  Call this number if you have problems the morning of surgery:  [phone]   Remember:  Do not eat food or drink liquids after midnight.  Take these medicines the morning of surgery with A SIP OF WATER None   Do not wear jewelry, make-up or nail polish.  Do not wear lotions, powders, or perfumes, or deodorant.  Do not shave 48 hours prior to surgery.  Men may shave face and neck.  Do not bring valuables to the hospital.  [facility] is not responsible for any belongings or valuables.  Contacts, dentures or bridgework may not be worn into surgery.  Leave your suitcase in the car.  After surgery it may be brought to your room.  For patients admitted to the hospital, discharge time will be determined by your treatment team.  Patients discharged the day of surgery will not be allowed to drive home.   Name and phone number of your driver:   family Special instructions:  Follow the diet and prep instructions given to you by Dr Olevia Perches office.  Please read over the following fact sheets that you were given. Anesthesia Post-op Instructions and Care and Recovery After Surgery       Colonoscopy, Adult A colonoscopy is an exam to look at the large intestine. It is done to check for problems, such as:  Lumps (tumors).  Growths (polyps).  Swelling (inflammation).  Bleeding.  What happens before the procedure? Eating and drinking Follow instructions from your doctor about eating and drinking. These instructions may include:  A few days before the procedure - follow a low-fiber diet. ? Avoid nuts. ? Avoid seeds. ? Avoid dried fruit. ? Avoid raw fruits. ? Avoid vegetables.  1-3 days before the procedure - follow a clear liquid diet. Avoid liquids that have red or purple dye. Drink only clear liquids, such as: ? Clear  broth or bouillon. ? Black coffee or tea. ? Clear juice. ? Clear soft drinks or sports drinks. ? Gelatin dessert. ? Popsicles.  On the day of the procedure - do not eat or drink anything during the 2 hours before the procedure.  Bowel prep If you were prescribed an oral bowel prep:  Take it as told by your doctor. Starting the day before your procedure, you will need to drink a lot of liquid. The liquid will cause you to poop (have bowel movements) until your poop is almost clear or light green.  If your skin or butt gets irritated from diarrhea, you may: ? Wipe the area with wipes that have medicine in them, such as adult wet wipes with aloe and vitamin E. ? Put something on your skin that soothes the area, such as petroleum jelly.  If you throw up (vomit) while drinking the bowel prep, take a break for up to 60 minutes. Then begin the bowel prep again. If you keep throwing up and you cannot take the bowel prep without throwing up, call your doctor.  General instructions  Ask your doctor about changing or stopping your normal medicines. This is important if you take diabetes medicines or blood thinners.  Plan to have someone take you home from the hospital or clinic. What happens during the procedure?  An IV tube may be put  into one of your veins.  You will be given medicine to help you relax (sedative).  To reduce your risk of infection: ? Your doctors will wash their hands. ? Your anal area will be washed with soap.  You will be asked to lie on your side with your knees bent.  Your doctor will get a long, thin, flexible tube ready. The tube will have a camera and a light on the end.  The tube will be put into your anus.  The tube will be gently put into your large intestine.  Air will be delivered into your large intestine to keep it open. You may feel some pressure or cramping.  The camera will be used to take photos.  A small tissue sample may be removed from your  body to be looked at under a microscope (biopsy). If any possible problems are found, the tissue will be sent to a lab for testing.  If small growths are found, your doctor may remove them and have them checked for cancer.  The tube that was put into your anus will be slowly removed. The procedure may vary among doctors and hospitals. What happens after the procedure?  Your doctor will check on you often until the medicines you were given have worn off.  Do not drive for 24 hours after the procedure.  You may have a small amount of blood in your poop.  You may pass gas.  You may have mild cramps or bloating in your belly (abdomen).  It is up to you to get the results of your procedure. Ask your doctor, or the department performing the procedure, when your results will be ready. This information is not intended to replace advice given to you by your health care provider. Make sure you discuss any questions you have with your health care provider. Document Released: 01/13/2011 Document Revised: 10/11/2016 Document Reviewed: 02/22/2016 Elsevier Interactive Patient Education  2017 [employer]  Colonoscopy, Adult, Care After This sheet gives you information about how to care for yourself after your procedure. Your health care provider may also give you more specific instructions. If you have problems or questions, contact your health care provider. What can I expect after the procedure? After the procedure, it is common to have:  A small amount of blood in your stool for 24 hours after the procedure.  Some gas.  Mild abdominal cramping or bloating.  Follow these instructions at home: General instructions   For the first 24 hours after the procedure: ? Do not drive or use machinery. ? Do not sign important documents. ? Do not drink alcohol. ? Do your regular daily activities at a slower pace than normal. ? Eat soft, easy-to-digest foods. ? Rest often.  Take over-the-counter  or prescription medicines only as told by your health care provider.  It is up to you to get the results of your procedure. Ask your health care provider, or the department performing the procedure, when your results will be ready. Relieving cramping and bloating  Try walking around when you have cramps or feel bloated.  Apply heat to your abdomen as told by your health care provider. Use a heat source that your health care provider recommends, such as a moist heat pack or a heating pad. ? Place a towel between your skin and the heat source. ? Leave the heat on for 20-30 minutes. ? Remove the heat if your skin turns bright red. This is especially important if you are unable  to feel pain, heat, or cold. You may have a greater risk of getting burned. Eating and drinking  Drink enough fluid to keep your urine clear or pale yellow.  Resume your normal diet as instructed by your health care provider. Avoid heavy or fried foods that are hard to digest.  Avoid drinking alcohol for as long as instructed by your health care provider. Contact a health care provider if:  You have blood in your stool 2-3 days after the procedure. Get help right away if:  You have more than a small spotting of blood in your stool.  You pass large blood clots in your stool.  Your abdomen is swollen.  You have nausea or vomiting.  You have a fever.  You have increasing abdominal pain that is not relieved with medicine. This information is not intended to replace advice given to you by your health care provider. Make sure you discuss any questions you have with your health care provider. Document Released: 07/25/2004 Document Revised: 09/04/2016 Document Reviewed: 02/22/2016 Elsevier Interactive Patient Education  2018 [place] Anesthesia is a term that refers to techniques, procedures, and medicines that help a person stay safe and comfortable during a medical procedure.  Monitored anesthesia care, or sedation, is one type of anesthesia. Your anesthesia specialist may recommend sedation if you will be having a procedure that does not require you to be unconscious, such as:  Cataract surgery.  A dental procedure.  A biopsy.  A colonoscopy.  During the procedure, you may receive a medicine to help you relax (sedative). There are three levels of sedation:  Mild sedation. At this level, you may feel awake and relaxed. You will be able to follow directions.  Moderate sedation. At this level, you will be sleepy. You may not remember the procedure.  Deep sedation. At this level, you will be asleep. You will not remember the procedure.  The more medicine you are given, the deeper your level of sedation will be. Depending on how you respond to the procedure, the anesthesia specialist may change your level of sedation or the type of anesthesia to fit your needs. An anesthesia specialist will monitor you closely during the procedure. Let your health care provider know about:  Any allergies you have.  All medicines you are taking, including vitamins, herbs, eye drops, creams, and over-the-counter medicines.  Any use of steroids (by mouth or as a cream).  Any problems you or family members have had with sedatives and anesthetic medicines.  Any blood disorders you have.  Any surgeries you have had.  Any medical conditions you have, such as sleep apnea.  Whether you are pregnant or may be pregnant.  Any use of cigarettes, alcohol, or street drugs. What are the risks? Generally, this is a safe procedure. However, problems may occur, including:  Getting too much medicine (oversedation).  Nausea.  Allergic reaction to medicines.  Trouble breathing. If this happens, a breathing tube may be used to help with breathing. It will be removed when you are awake and breathing on your own.  Heart trouble.  Lung trouble.  Before the procedure Staying  hydrated Follow instructions from your health care provider about hydration, which may include:  Up to 2 hours before the procedure - you may continue to drink clear liquids, such as water, clear fruit juice, black coffee, and plain tea.  Eating and drinking restrictions Follow instructions from your health care provider about eating and drinking, which may  include:  8 hours before the procedure - stop eating heavy meals or foods such as meat, fried foods, or fatty foods.  6 hours before the procedure - stop eating light meals or foods, such as toast or cereal.  6 hours before the procedure - stop drinking milk or drinks that contain milk.  2 hours before the procedure - stop drinking clear liquids.  Medicines Ask your health care provider about:  Changing or stopping your regular medicines. This is especially important if you are taking diabetes medicines or blood thinners.  Taking medicines such as aspirin and ibuprofen. These medicines can thin your blood. Do not take these medicines before your procedure if your health care provider instructs you not to.  Tests and exams  You will have a physical exam.  You may have blood tests done to show: ? How well your kidneys and liver are working. ? How well your blood can clot.  General instructions  Plan to have someone take you home from the hospital or clinic.  If you will be going home right after the procedure, plan to have someone with you for 24 hours.  What happens during the procedure?  Your blood pressure, heart rate, breathing, level of pain and overall condition will be monitored.  An IV tube will be inserted into one of your veins.  Your anesthesia specialist will give you medicines as needed to keep you comfortable during the procedure. This may mean changing the level of sedation.  The procedure will be performed. After the procedure  Your blood pressure, heart rate, breathing rate, and blood oxygen level  will be monitored until the medicines you were given have worn off.  Do not drive for 24 hours if you received a sedative.  You may: ? Feel sleepy, clumsy, or nauseous. ? Feel forgetful about what happened after the procedure. ? Have a sore throat if you had a breathing tube during the procedure. ? Vomit. This information is not intended to replace advice given to you by your health care provider. Make sure you discuss any questions you have with your health care provider. Document Released: 09/06/2005 Document Revised: 05/19/2016 Document Reviewed: 04/02/2016 Elsevier Interactive Patient Education  2018 [place], Care After These instructions provide you with information about caring for yourself after your procedure. Your health care provider may also give you more specific instructions. Your treatment has been planned according to current medical practices, but problems sometimes occur. Call your health care provider if you have any problems or questions after your procedure. What can I expect after the procedure? After your procedure, it is common to:  Feel sleepy for several hours.  Feel clumsy and have poor balance for several hours.  Feel forgetful about what happened after the procedure.  Have poor judgment for several hours.  Feel nauseous or vomit.  Have a sore throat if you had a breathing tube during the procedure.  Follow these instructions at home: For at least 24 hours after the procedure:   Do not: ? Participate in activities in which you could fall or become injured. ? Drive. ? Use heavy machinery. ? Drink alcohol. ? Take sleeping pills or medicines that cause drowsiness. ? Make important decisions or sign legal documents. ? Take care of children on your own.  Rest. Eating and drinking  Follow the diet that is recommended by your health care provider.  If you vomit, drink water, juice, or soup when you can drink without  vomiting.  Make sure you have little or no nausea before eating solid foods. General instructions  Have a responsible adult stay with you until you are awake and alert.  Take over-the-counter and prescription medicines only as told by your health care provider.  If you smoke, do not smoke without supervision.  Keep all follow-up visits as told by your health care provider. This is important. Contact a health care provider if:  You keep feeling nauseous or you keep vomiting.  You feel light-headed.  You develop a rash.  You have a fever. Get help right away if:  You have trouble breathing. This information is not intended to replace advice given to you by your health care provider. Make sure you discuss any questions you have with your health care provider. Document Released: 04/02/2016 Document Revised: 08/02/2016 Document Reviewed: 04/02/2016 Elsevier Interactive Patient Education  [employer].

## 2018-04-11 ENCOUNTER — Encounter (HOSPITAL_COMMUNITY): Payer: Self-pay

## 2018-04-11 ENCOUNTER — Other Ambulatory Visit: Payer: Self-pay

## 2018-04-11 ENCOUNTER — Encounter (HOSPITAL_COMMUNITY)
Admission: RE | Admit: 2018-04-11 | Discharge: 2018-04-11 | Disposition: A | Discharge status: Home / Self Care | Payer: Medicare Other | Source: Ambulatory Visit | Attending: Internal Medicine | Admitting: Internal Medicine

## 2018-04-11 DIAGNOSIS — Z01810 Encounter for preprocedural cardiovascular examination: Secondary | ICD-10-CM | POA: Diagnosis not present

## 2018-04-11 DIAGNOSIS — Z01812 Encounter for preprocedural laboratory examination: Secondary | ICD-10-CM | POA: Diagnosis present

## 2018-04-11 DIAGNOSIS — R195 Other fecal abnormalities: Secondary | ICD-10-CM

## 2018-04-11 HISTORY — DX: Personal history of urinary calculi: Z87.442

## 2018-04-11 LAB — CBC WITH DIFFERENTIAL/PLATELET
Basophils Absolute: 0 10*3/uL (ref 0.0–0.1)
Basophils Relative: 1 %
Eosinophils Absolute: 0.3 10*3/uL (ref 0.0–0.7)
Eosinophils Relative: 5 %
HEMATOCRIT: 45 % (ref 39.0–52.0)
HEMOGLOBIN: 14.8 g/dL (ref 13.0–17.0)
Lymphocytes Relative: 21 %
Lymphs Abs: 1.3 10*3/uL (ref 0.7–4.0)
MCH: 27.6 pg (ref 26.0–34.0)
MCHC: 32.9 g/dL (ref 30.0–36.0)
MCV: 83.8 fL (ref 78.0–100.0)
Monocytes Absolute: 0.5 10*3/uL (ref 0.1–1.0)
Monocytes Relative: 8 %
NEUTROS ABS: 4.1 10*3/uL (ref 1.7–7.7)
NEUTROS PCT: 65 %
Platelets: 178 10*3/uL (ref 150–400)
RBC: 5.37 MIL/uL (ref 4.22–5.81)
RDW: 13.4 % (ref 11.5–15.5)
WBC: 6.1 10*3/uL (ref 4.0–10.5)

## 2018-04-11 LAB — BASIC METABOLIC PANEL
Anion gap: 9 (ref 5–15)
BUN: 10 mg/dL (ref 6–20)
CO2: 25 mmol/L (ref 22–32)
CREATININE: 0.61 mg/dL (ref 0.61–1.24)
Calcium: 8.9 mg/dL (ref 8.9–10.3)
Chloride: 107 mmol/L (ref 101–111)
GFR calc Af Amer: >60 mL/min (ref >60)
GFR calc non Af Amer: >60 mL/min (ref >60)
Glucose, Bld: 129 mg/dL — ABNORMAL HIGH (ref 65–99)
Potassium: 3.6 mmol/L (ref 3.5–5.1)
Sodium: 141 mmol/L (ref 135–145)

## 2018-04-11 NOTE — Progress Notes (Signed)
   04/11/18 0959  OBSTRUCTIVE SLEEP APNEA  Have you ever been diagnosed with sleep apnea through a sleep study? No  Do you snore loudly (loud enough to be heard through closed doors)?  0  Do you often feel tired, fatigued, or sleepy during the daytime (such as falling asleep during driving or talking to someone)? 0  Has anyone observed you stop breathing during your sleep? 0  Do you have, or are you being treated for high blood pressure? 0  BMI more than 35 kg/m2? 1  Age > 50 (1-yes) 1  Neck circumference greater than:Male 16 inches or larger, Male 17inches or larger? 1  Male Gender (Yes=1) 1  Obstructive Sleep Apnea Score 4  Score 5 or greater  Results sent to PCP

## 2018-04-19 ENCOUNTER — Ambulatory Visit (HOSPITAL_COMMUNITY)
Admission: RE | Admit: 2018-04-19 | Discharge: 2018-04-19 | Disposition: A | Discharge status: Home / Self Care | Payer: Medicare Other | Source: Ambulatory Visit | Attending: Internal Medicine | Admitting: Internal Medicine

## 2018-04-19 ENCOUNTER — Encounter (HOSPITAL_COMMUNITY)
Admission: RE | Disposition: A | Discharge status: Home / Self Care | Payer: Self-pay | Source: Ambulatory Visit | Attending: Internal Medicine

## 2018-04-19 ENCOUNTER — Ambulatory Visit (HOSPITAL_COMMUNITY): Payer: Medicare Other | Admitting: Anesthesiology

## 2018-04-19 ENCOUNTER — Other Ambulatory Visit: Payer: Self-pay

## 2018-04-19 ENCOUNTER — Encounter (HOSPITAL_COMMUNITY): Payer: Self-pay

## 2018-04-19 DIAGNOSIS — K621 Rectal polyp: Secondary | ICD-10-CM | POA: Insufficient documentation

## 2018-04-19 DIAGNOSIS — Z87891 Personal history of nicotine dependence: Secondary | ICD-10-CM | POA: Insufficient documentation

## 2018-04-19 DIAGNOSIS — Z79899 Other long term (current) drug therapy: Secondary | ICD-10-CM | POA: Diagnosis not present

## 2018-04-19 DIAGNOSIS — N400 Enlarged prostate without lower urinary tract symptoms: Secondary | ICD-10-CM | POA: Diagnosis not present

## 2018-04-19 DIAGNOSIS — R195 Other fecal abnormalities: Secondary | ICD-10-CM | POA: Diagnosis not present

## 2018-04-19 DIAGNOSIS — D123 Benign neoplasm of transverse colon: Secondary | ICD-10-CM

## 2018-04-19 DIAGNOSIS — Z87442 Personal history of urinary calculi: Secondary | ICD-10-CM | POA: Insufficient documentation

## 2018-04-19 HISTORY — PX: POLYPECTOMY: SHX5525

## 2018-04-19 HISTORY — PX: COLONOSCOPY WITH PROPOFOL: SHX5780

## 2018-04-19 SURGERY — COLONOSCOPY WITH PROPOFOL
Anesthesia: Monitor Anesthesia Care

## 2018-04-19 MED ORDER — PROPOFOL 500 MG/50ML IV EMUL
INTRAVENOUS | Status: DC | PRN
  Administered 2018-04-19: 125 ug/kg/min via INTRAVENOUS

## 2018-04-19 MED ORDER — LACTATED RINGERS IV SOLN
INTRAVENOUS | Status: DC | PRN
  Administered 2018-04-19: 10:00:00 via INTRAVENOUS

## 2018-04-19 MED ORDER — LACTATED RINGERS IV SOLN: INTRAVENOUS | Status: DC

## 2018-04-19 MED ORDER — PROPOFOL 10 MG/ML IV BOLUS
INTRAVENOUS | Status: DC | PRN
  Administered 2018-04-19 (×2): 20 mg via INTRAVENOUS

## 2018-04-19 NOTE — Discharge Instructions (Addendum)
- Resume previous diet today.                           - Continue present medications.                           - No aspirin, ibuprofen, naproxen, or other                            non-steroidal anti-inflammatory drugs for 1 day.                           - Await pathology results.                           - Repeat colonoscopy is recommended. The                            colonoscopy date will be determined after pathology                            results from today's exam become available for                            review.    Monitored Anesthesia Care Anesthesia is a term that refers to techniques, procedures, and medicines that help a person stay safe and comfortable during a medical procedure. Monitored anesthesia care, or sedation, is one type of anesthesia. Your anesthesia specialist may recommend sedation if you will be having a procedure that does not require you to be unconscious, such as:  Cataract surgery.  A dental procedure.  A biopsy.  A colonoscopy.  During the procedure, you may receive a medicine to help you relax (sedative). There are three levels of sedation:  Mild sedation. At this level, you may feel awake and relaxed. You will be able to follow directions.  Moderate sedation. At this level, you will be sleepy. You may not remember the procedure.  Deep sedation. At this level, you will be asleep. You will not remember the procedure.  The more medicine you are given, the deeper your level of sedation will be. Depending on how you respond to the procedure, the anesthesia specialist may change your level of sedation or the type of anesthesia to fit your needs. An anesthesia specialist will monitor you closely during the procedure. Let your health care provider know about:  Any allergies you have.  All medicines you are taking, including vitamins, herbs, eye drops, creams, and over-the-counter medicines.  Any use of steroids  (by mouth or as a cream).  Any problems you or family members have had with sedatives and anesthetic medicines.  Any blood disorders you have.  Any surgeries you have had.  Any medical conditions you have, such as sleep apnea.  Whether you are pregnant or may be pregnant.  Any use of cigarettes, alcohol, or street drugs. What are the risks? Generally, this is a safe procedure. However, problems may occur, including:  Getting too much medicine (oversedation).  Nausea.  Allergic reaction to medicines.  Trouble breathing. If this happens, a breathing tube may be used to help with breathing. It will be removed when you are  awake and breathing on your own.  Heart trouble.  Lung trouble.  Before the procedure Staying hydrated Follow instructions from your health care provider about hydration, which may include:  Up to 2 hours before the procedure - you may continue to drink clear liquids, such as water, clear fruit juice, black coffee, and plain tea.  Eating and drinking restrictions Follow instructions from your health care provider about eating and drinking, which may include:  8 hours before the procedure - stop eating heavy meals or foods such as meat, fried foods, or fatty foods.  6 hours before the procedure - stop eating light meals or foods, such as toast or cereal.  6 hours before the procedure - stop drinking milk or drinks that contain milk.  2 hours before the procedure - stop drinking clear liquids.  Medicines Ask your health care provider about:  Changing or stopping your regular medicines. This is especially important if you are taking diabetes medicines or blood thinners.  Taking medicines such as aspirin and ibuprofen. These medicines can thin your blood. Do not take these medicines before your procedure if your health care provider instructs you not to.  Tests and exams  You will have a physical exam.  You may have blood tests done to show: ? How  well your kidneys and liver are working. ? How well your blood can clot.  General instructions  Plan to have someone take you home from the hospital or clinic.  If you will be going home right after the procedure, plan to have someone with you for 24 hours.  What happens during the procedure?  Your blood pressure, heart rate, breathing, level of pain and overall condition will be monitored.  An IV tube will be inserted into one of your veins.  Your anesthesia specialist will give you medicines as needed to keep you comfortable during the procedure. This may mean changing the level of sedation.  The procedure will be performed. After the procedure  Your blood pressure, heart rate, breathing rate, and blood oxygen level will be monitored until the medicines you were given have worn off.  Do not drive for 24 hours if you received a sedative.  You may: ? Feel sleepy, clumsy, or nauseous. ? Feel forgetful about what happened after the procedure. ? Have a sore throat if you had a breathing tube during the procedure. ? Vomit. This information is not intended to replace advice given to you by your health care provider. Make sure you discuss any questions you have with your health care provider. Document Released: 09/06/2005 Document Revised: 05/19/2016 Document Reviewed: 04/02/2016 Elsevier Interactive Patient Education  2018 [employer]. Colon Polyps Polyps are tissue growths inside the body. Polyps can grow in many places, including the large intestine (colon). A polyp may be a round bump or a mushroom-shaped growth. You could have one polyp or several. Most colon polyps are noncancerous (benign). However, some colon polyps can become cancerous over time. What are the causes? The exact cause of colon polyps is not known. What increases the risk? This condition is more likely to develop in people who:  Have a family history of colon cancer or colon polyps.  Are older than 72 or  older than 45 if they are African American.  Have inflammatory bowel disease, such as ulcerative colitis or Crohn disease.  Are overweight.  Smoke cigarettes.  Do not get enough exercise.  Drink too much alcohol.  Eat a diet that is: ? High  in fat and red meat. ? Low in fiber.  Had childhood cancer that was treated with abdominal radiation.  What are the signs or symptoms? Most polyps do not cause symptoms. If you have symptoms, they may include:  Blood coming from your rectum when having a bowel movement.  Blood in your stool.The stool may look dark red or black.  A change in bowel habits, such as constipation or diarrhea.  How is this diagnosed? This condition is diagnosed with a colonoscopy. This is a procedure that uses a lighted, flexible scope to look at the inside of your colon. How is this treated? Treatment for this condition involves removing any polyps that are found. Those polyps will then be tested for cancer. If cancer is found, your health care provider will talk to you about options for colon cancer treatment. Follow these instructions at home: Diet  Eat plenty of fiber, such as fruits, vegetables, and whole grains.  Eat foods that are high in calcium and vitamin D, such as milk, cheese, yogurt, eggs, liver, fish, and broccoli.  Limit foods high in fat, red meats, and processed meats, such as hot dogs, sausage, bacon, and lunch meats.  Maintain a healthy weight, or lose weight if recommended by your health care provider. General instructions  Do not smoke cigarettes.  Do not drink alcohol excessively.  Keep all follow-up visits as told by your health care provider. This is important. This includes keeping regularly scheduled colonoscopies. Talk to your health care provider about when you need a colonoscopy.  Exercise every day or as told by your health care provider. Contact a health care provider if:  You have new or worsening bleeding during a  bowel movement.  You have new or increased blood in your stool.  You have a change in bowel habits.  You unexpectedly lose weight. This information is not intended to replace advice given to you by your health care provider. Make sure you discuss any questions you have with your health care provider. Document Released: 09/06/2004 Document Revised: 05/18/2016 Document Reviewed: 11/01/2015 Elsevier Interactive Patient Education  2018 [employer]. Colonoscopy, Adult, Care After This sheet gives you information about how to care for yourself after your procedure. Your health care provider may also give you more specific instructions. If you have problems or questions, contact your health care provider. What can I expect after the procedure? After the procedure, it is common to have:  A small amount of blood in your stool for 24 hours after the procedure.  Some gas.  Mild abdominal cramping or bloating.  Follow these instructions at home: General instructions   For the first 24 hours after the procedure: ? Do not drive or use machinery. ? Do not sign important documents. ? Do not drink alcohol. ? Do your regular daily activities at a slower pace than normal. ? Eat soft, easy-to-digest foods. ? Rest often.  Take over-the-counter or prescription medicines only as told by your health care provider.  It is up to you to get the results of your procedure. Ask your health care provider, or the department performing the procedure, when your results will be ready. Relieving cramping and bloating  Try walking around when you have cramps or feel bloated.  Apply heat to your abdomen as told by your health care provider. Use a heat source that your health care provider recommends, such as a moist heat pack or a heating pad. ? Place a towel between your skin and the heat  source. ? Leave the heat on for 20-30 minutes. ? Remove the heat if your skin turns bright red. This is especially  important if you are unable to feel pain, heat, or cold. You may have a greater risk of getting burned. Eating and drinking  Drink enough fluid to keep your urine clear or pale yellow.  Resume your normal diet as instructed by your health care provider. Avoid heavy or fried foods that are hard to digest.  Avoid drinking alcohol for as long as instructed by your health care provider. Contact a health care provider if:  You have blood in your stool 2-3 days after the procedure. Get help right away if:  You have more than a small spotting of blood in your stool.  You pass large blood clots in your stool.  Your abdomen is swollen.  You have nausea or vomiting.  You have a fever.  You have increasing abdominal pain that is not relieved with medicine. This information is not intended to replace advice given to you by your health care provider. Make sure you discuss any questions you have with your health care provider. Document Released: 07/25/2004 Document Revised: 09/04/2016 Document Reviewed: 02/22/2016 Elsevier Interactive Patient Education  [employer].

## 2018-04-19 NOTE — H&P (Addendum)
Bernard Hill is an 62 y.o. male.   Chief Complaint: Patient is here for colonoscopy. HPI: Patient is 62 year old Caucasian male who was recently noted to have heme positive stool.  He is therefore undergoing diagnostic colonoscopy.  He denies melena or rectal bleeding.  He is prone to constipation because he takes pain medication.  He tries to control constipation by watching his diet and taking stool softener.  His last colonoscopy was normal in March 2011. He states his brother had bladder cancer which spread to colon.  He died at age 48.  Past Medical History:  Diagnosis Date  . Arthritis   . BPH (benign prostatic hypertrophy)   . Chronic back pain   . History of kidney stones     Past Surgical History:  Procedure Laterality Date  . COLONOSCOPY    . FINGER AMPUTATION     just surgery, not amputation.  . LUMBAR FUSION  [id number]   x3  . REPAIR EXTENSOR TENDON Right 09/01/2014   Procedure: EXPLORATION REPAIR FLEXOR SUPERFICIAL TENDON RIGHT MIDDLE FINGER  ;  Surgeon: Daryll Brod, MD;  Location: [facility];  Service: Orthopedics;  Laterality: Right;    History reviewed. No pertinent family history. Social History:  reports that he quit smoking about 38 years ago. His smoking use included cigarettes. He has a 10.00 pack-year smoking history. He has never used smokeless tobacco. He reports that he does not drink alcohol or use drugs.  Allergies: No Known Allergies  Medications Prior to Admission  Medication Sig Dispense Refill  . ANDROGEL PUMP 20.25 MG/ACT (1.62%) GEL Place 3 application onto the skin daily.  5  . cholecalciferol (VITAMIN D) 1000 units tablet Take 1,000 Units by mouth daily.    . tadalafil (CIALIS) 5 MG tablet Take 5 mg by mouth daily.     . Tetrahydrozoline HCl (VISINE OP) Place 1 drop into both eyes daily as needed (allergies).       Methadone 5 mg p.o. nightly.  No results found for this or any previous visit (from the past 48 hour(s)). No results  found.  ROS  Blood pressure 129/85, pulse 64, temperature 98.2 F (36.8 C), temperature source Oral, resp. rate 19, SpO2 95 %. Physical Exam  Constitutional: He appears well-developed and well-nourished.  HENT:  Mouth/Throat: Oropharynx is clear and moist.  Eyes: Conjunctivae are normal. No scleral icterus.  Neck: No thyromegaly present.  Cardiovascular: Normal rate, regular rhythm and normal heart sounds.  No murmur heard. Respiratory: Effort normal and breath sounds normal.  GI:  Abdomen is full.  It is soft and nontender without organomegaly or masses.  Musculoskeletal: He exhibits no edema.  Lymphadenopathy:    He has no cervical adenopathy.  Neurological: He is alert.  Skin: Skin is warm and dry.     Assessment/Plan Heme positive stool. Diagnostic colonoscopy.  Hildred Laser, MD 04/19/2018, 10:52 AM

## 2018-04-19 NOTE — Transfer of Care (Signed)
Immediate Anesthesia Transfer of Care Note  Patient: Bernard Hill  Procedure(s) Performed: COLONOSCOPY WITH PROPOFOL (N/A ) POLYPECTOMY  Patient Location: PACU  Anesthesia Type:MAC  Level of Consciousness: awake, alert  and patient cooperative  Airway & Oxygen Therapy: Patient Spontanous Breathing  Post-op Assessment: Report given to RN and Post -op Vital signs reviewed and stable  Post vital signs: Reviewed and stable  Last Vitals:  Vitals Value Taken Time  BP    Temp    Pulse    Resp    SpO2      Last Pain:  Vitals:   04/19/18 1100  TempSrc:   PainSc: 0-No pain         Complications: No apparent anesthesia complications

## 2018-04-19 NOTE — Anesthesia Postprocedure Evaluation (Signed)
Anesthesia Post Note  Patient: Bernard Hill  Procedure(s) Performed: COLONOSCOPY WITH PROPOFOL (N/A ) POLYPECTOMY  Patient location during evaluation: PACU Anesthesia Type: MAC Level of consciousness: awake and alert and patient cooperative Pain management: satisfactory to patient Vital Signs Assessment: post-procedure vital signs reviewed and stable Cardiovascular status: stable Postop Assessment: no apparent nausea or vomiting Anesthetic complications: no     Last Vitals:  Vitals:   04/19/18 1014 04/19/18 1137  BP: 129/85 99/75  Pulse: 64 68  Resp: 19 (!) 9  Temp: 36.8 C (P) 36.6 C  SpO2: 95% 100%    Last Pain:  Vitals:   04/19/18 1100  TempSrc:   PainSc: 0-No pain                 Ranelle Auker

## 2018-04-19 NOTE — Anesthesia Preprocedure Evaluation (Signed)
Anesthesia Evaluation  Patient identified by MRN, date of birth, ID band Patient awake    Reviewed: Allergy & Precautions, H&P , NPO status , Patient's Chart, lab work & pertinent test results, reviewed documented beta blocker date and time   Airway Mallampati: III  TM Distance: >3 FB Neck ROM: full    Dental no notable dental hx.    Pulmonary neg pulmonary ROS, former smoker,    Pulmonary exam normal breath sounds clear to auscultation       Cardiovascular Exercise Tolerance: Good negative cardio ROS   Rhythm:regular Rate:Normal     Neuro/Psych negative neurological ROS  negative psych ROS   GI/Hepatic negative GI ROS, Neg liver ROS, GERD  ,  Endo/Other  negative endocrine ROSMorbid obesity  Renal/GU negative Renal ROS  negative genitourinary   Musculoskeletal   Abdominal   Peds  Hematology negative hematology ROS (+)   Anesthesia Other Findings BMI:  42.4  Reproductive/Obstetrics negative OB ROS                             Anesthesia Physical Anesthesia Plan  ASA: III  Anesthesia Plan: MAC   Post-op Pain Management:    Induction:   PONV Risk Score and Plan:   Airway Management Planned:   Additional Equipment:   Intra-op Plan:   Post-operative Plan:   Informed Consent: I have reviewed the patients History and Physical, chart, labs and discussed the procedure including the risks, benefits and alternatives for the proposed anesthesia with the patient or authorized representative who has indicated his/her understanding and acceptance.   Dental Advisory Given  Plan Discussed with: CRNA  Anesthesia Plan Comments:         Anesthesia Quick Evaluation

## 2018-04-19 NOTE — Op Note (Signed)
[facility] Patient Name: Bernard Hill Procedure Date: 04/19/2018 10:35 AM MRN: [id number] Date of Birth: 1956-05-04 Attending MD: Hildred Laser , MD CSN: [id number] Age: 62 Admit Type: Outpatient Procedure:                Colonoscopy Indications:              Heme positive stool Providers:                Hildred Laser, MD, Otis Peak B. Sharon Seller, RN, Aram Candela Referring MD:             Glenda Chroman, MD Medicines:                Propofol per Anesthesia Complications:            No immediate complications. Estimated Blood Loss:     Estimated blood loss was minimal. Procedure:                Pre-Anesthesia Assessment:                           - Prior to the procedure, a History and Physical                            was performed, and patient medications and                            allergies were reviewed. The patient's tolerance of                            previous anesthesia was also reviewed. The risks                            and benefits of the procedure and the sedation                            options and risks were discussed with the patient.                            All questions were answered, and informed consent                            was obtained. Prior Anticoagulants: The patient has                            taken no previous anticoagulant or antiplatelet                            agents. ASA Grade Assessment: III - A patient with                            severe systemic disease. After reviewing the risks  and benefits, the patient was deemed in                            satisfactory condition to undergo the procedure.                           After obtaining informed consent, the colonoscope                            was passed under direct vision. Throughout the                            procedure, the patient's blood pressure, pulse, and                            oxygen saturations  were monitored continuously. The                            [id number] ([id number]) scope was introduced through                            the and advanced to the the cecum, identified by                            appendiceal orifice and ileocecal valve. The                            ileocecal valve, appendiceal orifice, and rectum                            were photographed. Scope In: 92:33:00 AM Scope Out: 11:30:11 AM Scope Withdrawal Time: 0 hours 20 minutes 2 seconds  Total Procedure Duration: 0 hours 24 minutes 0 seconds  Findings:      The perianal and digital rectal examinations were normal.      A 5 mm polyp was found in the transverse colon. The polyp was sessile.       The polyp was removed with a cold snare. Resection and retrieval were       complete. The pathology specimen was placed into Bottle Number 1.      A small polyp was found in the rectum. The polyp was sessile. Biopsies       were taken with a cold forceps for histology. The pathology specimen was       placed into Bottle Number 1.      The exam was otherwise without abnormality.      The retroflexed view of the distal rectum and anal verge was normal and       showed no anal or rectal abnormalities. Impression:               - One 5 mm polyp in the transverse colon, removed                            with a cold snare. Resected and retrieved.                           -  One small polyp in the rectum. Biopsied.                           - The examination was otherwise normal.                           - The distal rectum and anal verge are normal on                            retroflexion view. Moderate Sedation:      Per Anesthesia Care Recommendation:           - Patient has a contact number available for                            emergencies. The signs and symptoms of potential                            delayed complications were discussed with the                            patient. Return to normal  activities tomorrow.                            Written discharge instructions were provided to the                            patient.                           - Resume previous diet today.                           - Continue present medications.                           - No aspirin, ibuprofen, naproxen, or other                            non-steroidal anti-inflammatory drugs for 1 day.                           - Await pathology results.                           - Repeat colonoscopy is recommended. The                            colonoscopy date will be determined after pathology                            results from today's exam become available for                            review. Procedure Code(s):        --- Professional ---  45385, Colonoscopy, flexible; with removal of                            tumor(s), polyp(s), or other lesion(s) by snare                            technique                           45380, 59, Colonoscopy, flexible; with biopsy,                            single or multiple Diagnosis Code(s):        --- Professional ---                           D12.3, Benign neoplasm of transverse colon (hepatic                            flexure or splenic flexure)                           K62.1, Rectal polyp                           R19.5, Other fecal abnormalities CPT copyright 2017 [employer]. All rights reserved. The codes documented in this report are preliminary and upon coder review may  be revised to meet current compliance requirements. Hildred Laser, MD Hildred Laser, MD 04/19/2018 11:38:54 AM This report has been signed electronically. Number of Addenda: 0

## 2018-04-23 ENCOUNTER — Encounter (HOSPITAL_COMMUNITY): Payer: Self-pay | Admitting: Internal Medicine

## 2019-12-17 ENCOUNTER — Ambulatory Visit: Payer: Medicare Other | Admitting: Orthopedic Surgery

## 2019-12-29 ENCOUNTER — Ambulatory Visit: Payer: Medicare Other | Admitting: Orthopaedic Surgery

## 2019-12-29 ENCOUNTER — Other Ambulatory Visit: Payer: Self-pay

## 2019-12-29 ENCOUNTER — Ambulatory Visit: Payer: Self-pay

## 2019-12-29 ENCOUNTER — Encounter: Payer: Self-pay | Admitting: Orthopaedic Surgery

## 2019-12-29 VITALS — Ht 67.5 in | Wt 302.0 lb

## 2019-12-29 DIAGNOSIS — M1712 Unilateral primary osteoarthritis, left knee: Secondary | ICD-10-CM

## 2019-12-29 DIAGNOSIS — G894 Chronic pain syndrome: Secondary | ICD-10-CM | POA: Insufficient documentation

## 2019-12-29 DIAGNOSIS — N529 Male erectile dysfunction, unspecified: Secondary | ICD-10-CM | POA: Insufficient documentation

## 2019-12-29 DIAGNOSIS — E559 Vitamin D deficiency, unspecified: Secondary | ICD-10-CM | POA: Insufficient documentation

## 2019-12-29 NOTE — Progress Notes (Signed)
Office Visit Note   Patient: Bernard Hill           Date of Birth: 04/23/1956           MRN: [id number] Visit Date: 12/29/2019              Requested by: Glenda Chroman, MD [place],  [place] [id number] PCP: Glenda Chroman, MD   Assessment & Plan: Visit Diagnoses:  1. Primary osteoarthritis of left knee     Plan: Given patient's end-stage arthritis left knee recommend left total knee arthroplasty.  He would like to proceed with this sometime in the future however he would like to speak with his wife first.  Risk benefits discussed with the patient.  Risks reviewed with the patient but not limited to infection, PE/DVT, nerve or vessel injury, prolonged pain, and worsening pain.  Total knee model is shown to the patient today.  Postoperative protocol discussed with the patient at length.  Questions were encouraged and answered by Dr. Ninfa Linden and myself.  Follow-Up Instructions: Return if symptoms worsen or fail to improve.   Orders:  Orders Placed This Encounter  Procedures  . XR Knee 1-2 Views Left   No orders of the defined types were placed in this encounter.     Procedures: No procedures performed   Clinical Data: No additional findings.   Subjective: Chief Complaint  Patient presents with  . Left Knee - Pain    HPI  Bernard Hill 64 year old male were seen for the first time comes in today due to left knee pain for years.  He states he had knee arthroscopies on 5 years ago and that his knee pains became worse since then.  He denies any giving way catching locking.  He does have painful popping in the knee.  States that he is unable right knee full extension.  Occasionally uses knee brace he is using a walker or assistive device.  Reports he is tried hyaluronic acid injections and cortisone injections without any relief.  He is nondiabetic.  He uses Voltaren gel on the knee with minimal relief.  Said no known injury to the knee.  Review of Systems Negative for  fevers chills shortness of breath chest pain  Objective: Vital Signs: Ht 5' 7.5" (1.715 m)   Wt (!) 302 lb (137 kg)   BMI 46.60 kg/m   Physical Exam Constitutional:      Appearance: He is not ill-appearing or diaphoretic.  Pulmonary:     Effort: Pulmonary effort is normal.  Neurological:     Mental Status: He is alert and oriented to person, place, and time.  Psychiatric:        Mood and Affect: Mood normal.     Ortho Exam Left knee lacks full extension by at least 10 degrees.  Flexion left knee to approximately 100 degrees.  No instability valgus varus stressing.  There is no abnormal warmth erythema or effusion of the left knee.  Significant patellofemoral crepitus with range of motion of the knee.  Specialty Comments:  No specialty comments available.  Imaging: XR Knee 1-2 Views Left  Result Date: 12/29/2019 Left knee 2 views: Tricompartmental arthritis with end-stage arthritis involving the medial and patellofemoral compartments.  No acute fractures.  No bony abnormalities otherwise.    PMFS History: Patient Active Problem List   Diagnosis Date Noted  . Chronic pain syndrome 12/29/2019  . ED (erectile dysfunction) 12/29/2019  . Vitamin D deficiency 12/29/2019  .  Guaiac positive stools 03/08/2018  . Pain in left knee 03/01/2018  . Morbid (severe) obesity due to excess calories ([place]) 04/05/2017  . Osteoarthritis of left knee 01/24/2017  . PITUITARY INSUFFICIENCY 04/01/2009  . NEOPLASM OF UNCERTAIN BEHAVIOR OF SKIN 07/27/2008  . UPPER RESPIRATORY INFECTION, ACUTE, WITH BRONCHITIS 05/14/2008  . Other testicular hypofunction 01/01/2008  . ALLERGIC RHINITIS 12/30/2007  . GERD 12/30/2007  . OVERACTIVE BLADDER 12/30/2007  . BENIGN PROSTATIC HYPERTROPHY, WITH OBSTRUCTION 12/30/2007  . ARTHRITIS 12/30/2007  . LOW BACK PAIN 12/30/2007  . FATIGUE 12/30/2007  . CONSTIPATION, HX OF 12/30/2007   Past Medical History:  Diagnosis Date  . Arthritis   . BPH (benign  prostatic hypertrophy)   . Chronic back pain   . History of kidney stones     History reviewed. No pertinent family history.  Past Surgical History:  Procedure Laterality Date  . COLONOSCOPY    . COLONOSCOPY WITH PROPOFOL N/A 04/19/2018   Procedure: COLONOSCOPY WITH PROPOFOL;  Surgeon: Rogene Houston, MD;  [facility];  Service: Endoscopy;  Laterality: N/A;  12:25  . FINGER AMPUTATION     just surgery, not amputation.  . LUMBAR FUSION  [id number]   x3  . POLYPECTOMY  04/19/2018   Procedure: POLYPECTOMY;  Surgeon: Rogene Houston, MD;  [facility];  Service: Endoscopy;;  colon  . REPAIR EXTENSOR TENDON Right 09/01/2014   Procedure: EXPLORATION REPAIR FLEXOR SUPERFICIAL TENDON RIGHT MIDDLE FINGER  ;  Surgeon: Daryll Brod, MD;  Location: [facility];  Service: Orthopedics;  Laterality: Right;   Social History   Occupational History  . Not on file  Tobacco Use  . Smoking status: Former Smoker    Packs/day: 1.00    Years: 10.00    Pack years: 10.00    Types: Cigarettes    Quit date: 08/29/1979    Years since quitting: 40.3  . Smokeless tobacco: Never Used  Substance and Sexual Activity  . Alcohol use: No  . Drug use: No  . Sexual activity: Not Currently    Birth control/protection: None

## 2020-01-01 DIAGNOSIS — I10 Essential (primary) hypertension: Secondary | ICD-10-CM | POA: Diagnosis not present

## 2020-08-03 ENCOUNTER — Other Ambulatory Visit: Payer: Self-pay

## 2020-08-03 ENCOUNTER — Encounter: Payer: Self-pay | Admitting: Nurse Practitioner

## 2020-08-03 ENCOUNTER — Ambulatory Visit (INDEPENDENT_AMBULATORY_CARE_PROVIDER_SITE_OTHER): Payer: Medicare Other | Admitting: Nurse Practitioner

## 2020-08-03 VITALS — BP 128/78 | HR 76 | Temp 98.3°F | Resp 20 | Ht 69.0 in | Wt 299.0 lb

## 2020-08-03 DIAGNOSIS — Z6841 Body Mass Index (BMI) 40.0 and over, adult: Secondary | ICD-10-CM | POA: Diagnosis not present

## 2020-08-03 DIAGNOSIS — Z0000 Encounter for general adult medical examination without abnormal findings: Secondary | ICD-10-CM | POA: Diagnosis not present

## 2020-08-03 NOTE — Assessment & Plan Note (Signed)
Concerning patient's BMI patient is morbidily obese.  Discussed weight loss and diet modification, increase exercise to attain desired weight.  Patient will follow up in 3 months.  Advised patient to keep calorie counting calendar, meal prep, and exercise. Printed education material given to patient.

## 2020-08-03 NOTE — Progress Notes (Signed)
New Patient Office Visit  Subjective:  Patient ID: Bernard Hill, male    DOB: 06/05/1956  Age: 64 y.o. MRN: [id number]  CC:  Chief Complaint  Patient presents with  . Establish Care    new pt     HPI Bernard Hill presents for establishing care and  Encounter for general adult medical examination without abnormal findings  Physical : Patient's last physical exam was 1 year ago .  Weight: Not appropriate  for height (BMI greater than 27%) ;  Blood Pressure: Normal (BP less than 120/80) ;  Medical History: Patient history reviewed ; Family history reviewed ;  Allergies Reviewed: No change in current allergies ;  Medications Reviewed: Medications reviewed - no changes ;  Lipids: Normal lipid levels ; pending lab today Smoking: Life-long non-smoker ;  Physical Activity: Exercises at least 2 times per week ; brisk walking Alcohol/Drug Use: Is a non-drinker ; No illicit drug use ;  Patient is not afflicted from Stress Incontinence and Urge Incontinence  Safety: reviewed ; Patient wears a seat belt, has smoke detectors, has carbon monoxide detectors, practices appropriate gun safety, and wears sunscreen with extended sun exposure. Dental Care: annual cleanings, brushes and flosses daily. Ophthalmology/Optometry: Annual visit.  Cataract right eye surgery 08/16/2020, Hearing loss: none Vision impairments: Bilateral bilateral cataract    Past Medical History:  Diagnosis Date  . Arthritis   . BPH (benign prostatic hypertrophy)   . Chronic back pain   . History of kidney stones   . Hypogonadism in male     Past Surgical History:  Procedure Laterality Date  . COLONOSCOPY    . COLONOSCOPY WITH PROPOFOL N/A 04/19/2018   Procedure: COLONOSCOPY WITH PROPOFOL;  Surgeon: Rogene Houston, MD;  [facility];  Service: Endoscopy;  Laterality: N/A;  12:25  . HAND SURGERY Right 2017   palm near middle finger   . LUMBAR FUSION  90,94,96   x3  . POLYPECTOMY  04/19/2018    Procedure: POLYPECTOMY;  Surgeon: Rogene Houston, MD;  [facility];  Service: Endoscopy;;  colon  . REPAIR EXTENSOR TENDON Right 09/01/2014   Procedure: EXPLORATION REPAIR FLEXOR SUPERFICIAL TENDON RIGHT MIDDLE FINGER  ;  Surgeon: Daryll Brod, MD;  Location: [facility];  Service: Orthopedics;  Laterality: Right;    Family History  Problem Relation Age of Onset  . Cancer Mother        breast   . Heart disease Mother   . Hypertension Sister   . Cancer Brother        bladder and colon   . Asthma Daughter   . Hypertension Daughter     Social History   Socioeconomic History  . Marital status: Married    Spouse name: [name]  . Number of children: 3  . Years of education: Not on file  . Highest education level: Not on file  Occupational History  . Occupation: truck Geophysicist/field seismologist    Comment: part time   Tobacco Use  . Smoking status: Former Smoker    Packs/day: 1.00    Years: 10.00    Pack years: 10.00    Types: Cigarettes    Quit date: 08/29/1979    Years since quitting: 40.9  . Smokeless tobacco: Never Used  Vaping Use  . Vaping Use: Never used  Substance and Sexual Activity  . Alcohol use: No  . Drug use: No  . Sexual activity: Not Currently    Birth control/protection:  None  Other Topics Concern  . Not on file  Social History Narrative  . Not on file   Social Determinants of Health   Financial Resource Strain:   . Difficulty of Paying Living Expenses:   Food Insecurity:   . Worried About Charity fundraiser in the Last Year:   . Arboriculturist in the Last Year:   Transportation Needs:   . Film/video editor (Medical):   Marland Kitchen Lack of Transportation (Non-Medical):   Physical Activity:   . Days of Exercise per Week:   . Minutes of Exercise per Session:   Stress:   . Feeling of Stress :   Social Connections:   . Frequency of Communication with Friends and Family:   . Frequency of Social Gatherings with Friends and Family:   . Attends  Religious Services:   . Active Member of Clubs or Organizations:   . Attends Archivist Meetings:   Marland Kitchen Marital Status:   Intimate Partner Violence:   . Fear of Current or Ex-Partner:   . Emotionally Abused:   Marland Kitchen Physically Abused:   . Sexually Abused:     ROS Review of Systems  Constitutional: Negative.   HENT: Negative.   Eyes: Negative.   Respiratory: Negative.   Cardiovascular: Negative.   Gastrointestinal: Negative.   Genitourinary: Negative.   Musculoskeletal: Negative.   Skin: Negative.   Neurological: Negative.   Psychiatric/Behavioral: Negative.     Objective:   Today's Vitals: BP 128/78   Pulse 76   Temp 98.3 F (36.8 C)   Resp 20   Ht 5\' 9"  (1.753 m)   Wt 299 lb (135.6 kg)   SpO2 96%   BMI 44.15 kg/m   Physical Exam Constitutional:      General: He is not in acute distress.    Appearance: Normal appearance. He is obese.  HENT:     Head: Normocephalic.     Right Ear: Ear canal and external ear normal.     Left Ear: Ear canal and external ear normal.     Nose: Nose normal.     Mouth/Throat:     Mouth: Mucous membranes are moist.  Eyes:     Conjunctiva/sclera: Conjunctivae normal.  Cardiovascular:     Rate and Rhythm: Normal rate and regular rhythm.     Pulses: Normal pulses.     Heart sounds: Normal heart sounds.  Pulmonary:     Effort: Pulmonary effort is normal.     Breath sounds: Normal breath sounds.  Abdominal:     General: Bowel sounds are normal.  Musculoskeletal:        General: Normal range of motion.     Cervical back: Normal range of motion.  Skin:    General: Skin is warm.  Neurological:     Mental Status: He is alert and oriented to person, place, and time.  Psychiatric:        Mood and Affect: Mood normal.        Behavior: Behavior normal.     Assessment & Plan:   Problem List Items Addressed This Visit      Other   Annual physical exam - Primary    Patient is a 64 year old male who presents to clinic for an  annual physical exam and establishing care.  Patient has no new concerns today, health maintenance, diet and exercise discussed as part of patient's visit.  Provided printed handouts.  Advised patient to start calorie counting and exercise  for BMI above 44.15kg.  Follow-up in 3 months.  Patient has cataracts eye surgery coming up 08/16/2020. Completed labs CBC, CMP, PSA, Pending lab results.       Relevant Orders   PSA   CBC with Differential   Comprehensive metabolic panel   Lipid Panel   BMI 40.0-44.9, adult ([place])    Concerning patient's BMI patient is morbidily obese.  Discussed weight loss and diet modification, increase exercise to attain desired weight.  Patient will follow up in 3 months.  Advised patient to keep calorie counting calendar, meal prep, and exercise. Printed education material given to patient.       Relevant Orders   CBC with Differential   Comprehensive metabolic panel      Outpatient Encounter Medications as of 08/03/2020  Medication Sig  . ANDROGEL PUMP 20.25 MG/ACT (1.62%) GEL Place 3 application onto the skin daily.  . cholecalciferol (VITAMIN D) 1000 units tablet Take 1,000 Units by mouth daily.  . diclofenac Sodium (VOLTAREN) 1 % GEL SMARTSIG:4 Gram(s) Topical 4 Times Daily PRN  . tadalafil (CIALIS) 5 MG tablet Cialis 5 mg tablet  TAKE ONE TABLET BY MOUTH ONCE DAILY  . [DISCONTINUED] methadone (DOLOPHINE) 5 MG tablet Take 5 mg by mouth daily.  . [DISCONTINUED] tadalafil (CIALIS) 5 MG tablet Take 5 mg by mouth daily.   . [DISCONTINUED] Tetrahydrozoline HCl (VISINE OP) Place 1 drop into both eyes daily as needed (allergies).   No facility-administered encounter medications on file as of 08/03/2020.    Follow-up: Return in about 1 year (around 08/03/2021).   Ivy Lynn, NP

## 2020-08-03 NOTE — H&P (Signed)
Surgical History & Physical  Patient Name: Bernard Hill DOB: September 25, 1956  Surgery: Cataract extraction with intraocular lens implant phacoemulsification; Right Eye  Surgeon: Bernard Goldmann MD Surgery Date:  08/16/2020 Pre-Op Date:  07/26/2020  HPI: [name] patient Pt present for Cataract Evaluation. The patient complains of difficulty when viewing TV, reading closed caption, news scrolls on TV. Both eyes are affected. The episode is constant. The condition is worse with daily activities. The complaint is associated with blurry vision, glare and halos. This is negatively affecting the patient's quality of life. The patient experiences no flashes, floater, shadow, curtain or veil. HPI was performed by Bernard Hill .  Medical History: Cataracts Arthritis, Back Pain  Review of Systems Negative Allergic/Immunologic Negative Cardiovascular Negative Constitutional Negative Endocrine Negative Eyes Negative Gastrointestinal Negative Genitourinary Negative Hemotologic/Lymphatic Negative Integumentary Negative Musculoskeletal Negative Neurological Negative Psychiatry Negative Respiratory  Social   Never smoked  Medication Vitamin D, Testosterone,   Sx/Procedures  None  Drug Allergies   NKDA  History & Physical: Heent:  Cataract, Right eye NECK: supple without bruits LUNGS: lungs clear to auscultation CV: regular rate and rhythm Abdomen: soft and non-tender  Impression & Plan: Assessment: 1.  NUCLEAR SCLEROSIS AGE RELATED; Both Eyes (H25.13)  Plan: 1.  Cataract accounts for the patient's decreased vision. This visual impairment is not correctable with a tolerable change in glasses or contact lenses. Cataract surgery with an implantation of a new lens should significantly improve the visual and functional status of the patient. Discussed all risks, benefits, alternatives, and potential complications. Discussed the procedures and recovery. Patient desires to have  surgery. A-scan ordered and performed today for intra-ocular lens calculations. The surgery will be performed in order to improve vision for driving, reading, and for eye examinations. Recommend phacoemulsification with intra-ocular lens. Right Eye non-dominant - first. Dilates poorly - shugacaine by protocol. [place].

## 2020-08-03 NOTE — Patient Instructions (Addendum)
Health Maintenance After Age 66 After age 71, you are at a higher risk for certain long-term diseases and infections as well as injuries from falls. Falls are a major cause of broken bones and head injuries in people who are older than age 66. Getting regular preventive care can help to keep you healthy and well. Preventive care includes getting regular testing and making lifestyle changes as recommended by your health care provider. Talk with your health care provider about:  Which screenings and tests you should have. A screening is a test that checks for a disease when you have no symptoms.  A diet and exercise plan that is right for you. What should I know about screenings and tests to prevent falls? Screening and testing are the best ways to find a health problem early. Early diagnosis and treatment give you the best chance of managing medical conditions that are common after age 20. Certain conditions and lifestyle choices may make you more likely to have a fall. Your health care provider may recommend:  Regular vision checks. Poor vision and conditions such as cataracts can make you more likely to have a fall. If you wear glasses, make sure to get your prescription updated if your vision changes.  Medicine review. Work with your health care provider to regularly review all of the medicines you are taking, including over-the-counter medicines. Ask your health care provider about any side effects that may make you more likely to have a fall. Tell your health care provider if any medicines that you take make you feel dizzy or sleepy.  Osteoporosis screening. Osteoporosis is a condition that causes the bones to get weaker. This can make the bones weak and cause them to break more easily.  Blood pressure screening. Blood pressure changes and medicines to control blood pressure can make you feel dizzy.  Strength and balance checks. Your health care provider may recommend certain tests to check your  strength and balance while standing, walking, or changing positions.  Foot health exam. Foot pain and numbness, as well as not wearing proper footwear, can make you more likely to have a fall.  Depression screening. You may be more likely to have a fall if you have a fear of falling, feel emotionally low, or feel unable to do activities that you used to do.  Alcohol use screening. Using too much alcohol can affect your balance and may make you more likely to have a fall. What actions can I take to lower my risk of falls? General instructions  Talk with your health care provider about your risks for falling. Tell your health care provider if: ? You fall. Be sure to tell your health care provider about all falls, even ones that seem minor. ? You feel dizzy, sleepy, or off-balance.  Take over-the-counter and prescription medicines only as told by your health care provider. These include any supplements.  Eat a healthy diet and maintain a healthy weight. A healthy diet includes low-fat dairy products, low-fat (lean) meats, and fiber from whole grains, beans, and lots of fruits and vegetables. Home safety  Remove any tripping hazards, such as rugs, cords, and clutter.  Install safety equipment such as grab bars in bathrooms and safety rails on stairs.  Keep rooms and walkways well-lit. Activity  Calorie Counting for Weight Loss Calories are units of energy. Your body needs a certain amount of calories from food to keep you going throughout the day. When you eat more calories than your body needs,  your body stores the extra calories as fat. When you eat fewer calories than your body needs, your body burns fat to get the energy it needs. Calorie counting means keeping track of how many calories you eat and drink each day. Calorie counting can be helpful if you need to lose weight. If you make sure to eat fewer calories than your body needs, you should lose weight. Ask your health care provider  what a healthy weight is for you. For calorie counting to work, you will need to eat the right number of calories in a day in order to lose a healthy amount of weight per week. A dietitian can help you determine how many calories you need in a day and will give you suggestions on how to reach your calorie goal.  A healthy amount of weight to lose per week is usually 1-2 lb (0.5-0.9 kg). This usually means that your daily calorie intake should be reduced by 500-750 calories.  Eating 1,200 - 1,500 calories per day can help most women lose weight.  Eating 1,500 - 1,800 calories per day can help most men lose weight. What is my plan? My goal is to have __________ calories per day. If I have this many calories per day, I should lose around __________ pounds per week. What do I need to know about calorie counting? In order to meet your daily calorie goal, you will need to:  Find out how many calories are in each food you would like to eat. Try to do this before you eat.  Decide how much of the food you plan to eat.  Write down what you ate and how many calories it had. Doing this is called keeping a food log. To successfully lose weight, it is important to balance calorie counting with a healthy lifestyle that includes regular activity. Aim for 150 minutes of moderate exercise (such as walking) or 75 minutes of vigorous exercise (such as running) each week. Where do I find calorie information?  The number of calories in a food can be found on a Nutrition Facts label. If a food does not have a Nutrition Facts label, try to look up the calories online or ask your dietitian for help. Remember that calories are listed per serving. If you choose to have more than one serving of a food, you will have to multiply the calories per serving by the amount of servings you plan to eat. For example, the label on a package of bread might say that a serving size is 1 slice and that there are 90 calories in a  serving. If you eat 1 slice, you will have eaten 90 calories. If you eat 2 slices, you will have eaten 180 calories. How do I keep a food log? Immediately after each meal, record the following information in your food log:  What you ate. Don't forget to include toppings, sauces, and other extras on the food.  How much you ate. This can be measured in cups, ounces, or number of items.  How many calories each food and drink had.  The total number of calories in the meal. Keep your food log near you, such as in a small notebook in your pocket, or use a mobile app or website. Some programs will calculate calories for you and show you how many calories you have left for the day to meet your goal. What are some calorie counting tips?   Use your calories on foods and drinks that  will fill you up and not leave you hungry: ? Some examples of foods that fill you up are nuts and nut butters, vegetables, lean proteins, and high-fiber foods like whole grains. High-fiber foods are foods with more than 5 g fiber per serving. ? Drinks such as sodas, specialty coffee drinks, alcohol, and juices have a lot of calories, yet do not fill you up.  Eat nutritious foods and avoid empty calories. Empty calories are calories you get from foods or beverages that do not have many vitamins or protein, such as candy, sweets, and soda. It is better to have a nutritious high-calorie food (such as an avocado) than a food with few nutrients (such as a bag of chips).  Know how many calories are in the foods you eat most often. This will help you calculate calorie counts faster.  Pay attention to calories in drinks. Low-calorie drinks include water and unsweetened drinks.  Pay attention to nutrition labels for "low fat" or "fat free" foods. These foods sometimes have the same amount of calories or more calories than the full fat versions. They also often have added sugar, starch, or salt, to make up for flavor that was removed  with the fat.  Find a way of tracking calories that works for you. Get creative. Try different apps or programs if writing down calories does not work for you. What are some portion control tips?  Know how many calories are in a serving. This will help you know how many servings of a certain food you can have.  Use a measuring cup to measure serving sizes. You could also try weighing out portions on a kitchen scale. With time, you will be able to estimate serving sizes for some foods.  Take some time to put servings of different foods on your favorite plates, bowls, and cups so you know what a serving looks like.  Try not to eat straight from a bag or box. Doing this can lead to overeating. Put the amount you would like to eat in a cup or on a plate to make sure you are eating the right portion.  Use smaller plates, glasses, and bowls to prevent overeating.  Try not to multitask (for example, watch TV or use your computer) while eating. If it is time to eat, sit down at a table and enjoy your food. This will help you to know when you are full. It will also help you to be aware of what you are eating and how much you are eating. What are tips for following this plan? Reading food labels  Check the calorie count compared to the serving size. The serving size may be smaller than what you are used to eating.  Check the source of the calories. Make sure the food you are eating is high in vitamins and protein and low in saturated and trans fats. Shopping  Read nutrition labels while you shop. This will help you make healthy decisions before you decide to purchase your food.  Make a grocery list and stick to it. Cooking  Try to cook your favorite foods in a healthier way. For example, try baking instead of frying.  Use low-fat dairy products. Meal planning  Use more fruits and vegetables. Half of your plate should be fruits and vegetables.  Include lean proteins like poultry and  fish. How do I count calories when eating out?  Ask for smaller portion sizes.  Consider sharing an entree and sides instead of getting your  own entree.  If you get your own entree, eat only half. Ask for a box at the beginning of your meal and put the rest of your entree in it so you are not tempted to eat it.  If calories are listed on the menu, choose the lower calorie options.  Choose dishes that include vegetables, fruits, whole grains, low-fat dairy products, and lean protein.  Choose items that are boiled, broiled, grilled, or steamed. Stay away from items that are buttered, battered, fried, or served with cream sauce. Items labeled "crispy" are usually fried, unless stated otherwise.  Choose water, low-fat milk, unsweetened iced tea, or other drinks without added sugar. If you want an alcoholic beverage, choose a lower calorie option such as a glass of wine or light beer.  Ask for dressings, sauces, and syrups on the side. These are usually high in calories, so you should limit the amount you eat.  If you want a salad, choose a garden salad and ask for grilled meats. Avoid extra toppings like bacon, cheese, or fried items. Ask for the dressing on the side, or ask for olive oil and vinegar or lemon to use as dressing.  Estimate how many servings of a food you are given. For example, a serving of cooked rice is  cup or about the size of half a baseball. Knowing serving sizes will help you be aware of how much food you are eating at restaurants. The list below tells you how big or small some common portion sizes are based on everyday objects: ? 1 oz--4 stacked dice. ? 3 oz--1 deck of cards. ? 1 tsp--1 die. ? 1 Tbsp-- a ping-pong ball. ? 2 Tbsp--1 ping-pong ball. ?  cup-- baseball. ? 1 cup--1 baseball. Summary  Calorie counting means keeping track of how many calories you eat and drink each day. If you eat fewer calories than your body needs, you should lose weight.  A  healthy amount of weight to lose per week is usually 1-2 lb (0.5-0.9 kg). This usually means reducing your daily calorie intake by 500-750 calories.  The number of calories in a food can be found on a Nutrition Facts label. If a food does not have a Nutrition Facts label, try to look up the calories online or ask your dietitian for help.  Use your calories on foods and drinks that will fill you up, and not on foods and drinks that will leave you hungry.  Use smaller plates, glasses, and bowls to prevent overeating. This information is not intended to replace advice given to you by your health care provider. Make sure you discuss any questions you have with your health care provider. Document Revised: 08/30/2018 Document Reviewed: 11/10/2016 Elsevier Patient Education  2020 [employer].   Follow a regular exercise program to stay fit. This will help you maintain your balance. Ask your health care provider what types of exercise are appropriate for you.  If you need a cane or walker, use it as recommended by your health care provider.  Wear supportive shoes that have nonskid soles. Lifestyle  Do not drink alcohol if your health care provider tells you not to drink.  If you drink alcohol, limit how much you have: ? 0-1 drink a day for women. ? 0-2 drinks a day for men.  Be aware of how much alcohol is in your drink. In the [place] one drink equals one typical bottle of beer (12 oz), one-half glass of wine (5 oz), or one  shot of hard liquor (1 oz).  Do not use any products that contain nicotine or tobacco, such as cigarettes and e-cigarettes. If you need help quitting, ask your health care provider. Summary  Having a healthy lifestyle and getting preventive care can help to protect your health and wellness after age 2.  Screening and testing are the best way to find a health problem early and help you avoid having a fall. Early diagnosis and treatment give you the best chance for  managing medical conditions that are more common for people who are older than age 50.  Falls are a major cause of broken bones and head injuries in people who are older than age 58. Take precautions to prevent a fall at home.  Work with your health care provider to learn what changes you can make to improve your health and wellness and to prevent falls. This information is not intended to replace advice given to you by your health care provider. Make sure you discuss any questions you have with your health care provider. Document Revised: 04/03/2019 Document Reviewed: 10/24/2017 Elsevier Patient Education  2020 [employer].

## 2020-08-03 NOTE — Assessment & Plan Note (Signed)
Patient is a 64 year old male who presents to clinic for an annual physical exam and establishing care.  Patient has no new concerns today, health maintenance, diet and exercise discussed as part of patient's visit.  Provided printed handouts.  Advised patient to start calorie counting and exercise for BMI above 44.15kg.  Follow-up in 3 months.  Patient has cataracts eye surgery coming up 08/16/2020. Completed labs CBC, CMP, PSA, Pending lab results.

## 2020-08-04 LAB — CBC WITH DIFFERENTIAL/PLATELET
Basophils Absolute: 0.1 10*3/uL (ref 0.0–0.2)
Basos: 1 %
EOS (ABSOLUTE): 0.3 10*3/uL (ref 0.0–0.4)
Eos: 4 %
Hematocrit: 50.4 % (ref 37.5–51.0)
Hemoglobin: 16.5 g/dL (ref 13.0–17.7)
Immature Grans (Abs): 0 10*3/uL (ref 0.0–0.1)
Immature Granulocytes: 0 %
Lymphocytes Absolute: 1.5 10*3/uL (ref 0.7–3.1)
Lymphs: 20 %
MCH: 27.1 pg (ref 26.6–33.0)
MCHC: 32.7 g/dL (ref 31.5–35.7)
MCV: 83 fL (ref 79–97)
Monocytes Absolute: 0.7 10*3/uL (ref 0.1–0.9)
Monocytes: 9 %
Neutrophils Absolute: 5 10*3/uL (ref 1.4–7.0)
Neutrophils: 66 %
Platelets: 195 10*3/uL (ref 150–450)
RBC: 6.08 x10E6/uL — ABNORMAL HIGH (ref 4.14–5.80)
RDW: 13.4 % (ref 11.6–15.4)
WBC: 7.6 10*3/uL (ref 3.4–10.8)

## 2020-08-04 LAB — COMPREHENSIVE METABOLIC PANEL
ALT: 21 IU/L (ref 0–44)
AST: 14 IU/L (ref 0–40)
Albumin/Globulin Ratio: 2 (ref 1.2–2.2)
Albumin: 4.4 g/dL (ref 3.8–4.8)
Alkaline Phosphatase: 60 IU/L (ref 48–121)
BUN/Creatinine Ratio: 19 (ref 10–24)
BUN: 14 mg/dL (ref 8–27)
Bilirubin Total: 0.6 mg/dL (ref 0.0–1.2)
CO2: 25 mmol/L (ref 20–29)
Calcium: 9.6 mg/dL (ref 8.6–10.2)
Chloride: 104 mmol/L (ref 96–106)
Creatinine, Ser: 0.74 mg/dL — ABNORMAL LOW (ref 0.76–1.27)
GFR calc Af Amer: 112 mL/min/{1.73_m2} (ref >59)
GFR calc non Af Amer: 97 mL/min/{1.73_m2} (ref >59)
Globulin, Total: 2.2 g/dL (ref 1.5–4.5)
Glucose: 87 mg/dL (ref 65–99)
Potassium: 4.5 mmol/L (ref 3.5–5.2)
Sodium: 141 mmol/L (ref 134–144)
Total Protein: 6.6 g/dL (ref 6.0–8.5)

## 2020-08-04 LAB — LIPID PANEL
Chol/HDL Ratio: 4.2 ratio (ref 0.0–5.0)
Cholesterol, Total: 167 mg/dL (ref 100–199)
HDL: 40 mg/dL (ref >39)
LDL Chol Calc (NIH): 104 mg/dL — ABNORMAL HIGH (ref 0–99)
Triglycerides: 130 mg/dL (ref 0–149)
VLDL Cholesterol Cal: 23 mg/dL (ref 5–40)

## 2020-08-04 LAB — PSA: Prostate Specific Ag, Serum: 1.8 ng/mL (ref 0.0–4.0)

## 2020-08-12 NOTE — Patient Instructions (Signed)
Bernard Hill  08/12/2020     @PREFPERIOPPHARMACY @   Your procedure is scheduled on  08/16/2020 .  Report to [facility] at  0800  A.M.  Call this number if you have problems the morning of surgery:  [phone]   Remember:  Do not eat or drink after midnight.                         Take these medicines the morning of surgery with A SIP OF WATER  None    Do not wear jewelry, make-up or nail polish.  Do not wear lotions, powders, or perfumes, or deodorant.  Do not shave 48 hours prior to surgery.  Men may shave face and neck.  Do not bring valuables to the hospital.  [facility] is not responsible for any belongings or valuables.  Contacts, dentures or bridgework may not be worn into surgery.  Leave your suitcase in the car.  After surgery it may be brought to your room.  For patients admitted to the hospital, discharge time will be determined by your treatment team.  Patients discharged the day of surgery will not be allowed to drive home.   Name and phone number of your driver:   family Special instructions:  DO NOT smoke the morning of your procedure.  Please read over the following fact sheets that you were given. Anesthesia Post-op Instructions and Care and Recovery After Surgery      Cataract Surgery, Care After This sheet gives you information about how to care for yourself after your procedure. Your health care provider may also give you more specific instructions. If you have problems or questions, contact your health care provider. What can I expect after the procedure? After the procedure, it is common to have:  Itching.  Discomfort.  Fluid discharge.  Sensitivity to light and to touch.  Bruising in or around the eye.  Mild blurred vision. Follow these instructions at home: Eye care   Do not touch or rub your eyes.  Protect your eyes as told by your health care provider. You may be told to wear a protective eye shield or sunglasses.  Do  not put a contact lens into the affected eye or eyes until your health care provider approves.  Keep the area around your eye clean and dry: ? Avoid swimming. ? Do not allow water to hit you directly in the face while showering. ? Keep soap and shampoo out of your eyes.  Check your eye every day for signs of infection. Watch for: ? Redness, swelling, or pain. ? Fluid, blood, or pus. ? Warmth. ? A bad smell. ? Vision that is getting worse. ? Sensitivity that is getting worse. Activity  Do not drive for 24 hours if you were given a sedative during your procedure.  Avoid strenuous activities, such as playing contact sports, for as long as told by your health care provider.  Do not drive or use heavy machinery until your health care provider approves.  Do not bend or lift heavy objects. Bending increases pressure in the eye. You can walk, climb stairs, and do light household chores.  Ask your health care provider when you can return to work. If you work in a dusty environment, you may be advised to wear protective eyewear for a period of time. General instructions  Take or apply over-the-counter and prescription medicines only as told by your health care provider. This  includes eye drops.  Keep all follow-up visits as told by your health care provider. This is important. Contact a health care provider if:  You have increased bruising around your eye.  You have pain that is not helped with medicine.  You have a fever.  You have redness, swelling, or pain in your eye.  You have fluid, blood, or pus coming from your incision.  Your vision gets worse.  Your sensitivity to light gets worse. Get help right away if:  You have sudden loss of vision.  You see flashes of light or spots (floaters).  You have severe eye pain.  You develop nausea or vomiting. Summary  After your procedure, it is common to have itching, discomfort, bruising, fluid discharge, or sensitivity to  light.  Follow instructions from your health care provider about caring for your eye after the procedure.  Do not rub your eye after the procedure. You may need to wear eye protection or sunglasses. Do not wear contact lenses. Keep the area around your eye clean and dry.  Avoid activities that require a lot of effort. These include playing sports and lifting heavy objects.  Contact a health care provider if you have increased bruising, pain that does not go away, or a fever. Get help right away if you suddenly lose your vision, see flashes of light or spots, or have severe pain in the eye. This information is not intended to replace advice given to you by your health care provider. Make sure you discuss any questions you have with your health care provider. Document Revised: 10/07/2019 Document Reviewed: 06/10/2018 Elsevier Patient Education  [employer].

## 2020-08-13 ENCOUNTER — Other Ambulatory Visit: Payer: Self-pay

## 2020-08-13 ENCOUNTER — Encounter (HOSPITAL_COMMUNITY)
Admission: RE | Admit: 2020-08-13 | Discharge: 2020-08-13 | Disposition: A | Discharge status: Home / Self Care | Payer: Medicare Other | Source: Ambulatory Visit | Attending: Ophthalmology | Admitting: Ophthalmology

## 2020-08-13 ENCOUNTER — Other Ambulatory Visit (HOSPITAL_COMMUNITY)
Admission: RE | Admit: 2020-08-13 | Discharge: 2020-08-13 | Disposition: A | Discharge status: Home / Self Care | Payer: Medicare Other | Source: Ambulatory Visit | Attending: Ophthalmology | Admitting: Ophthalmology

## 2020-08-13 DIAGNOSIS — Z20822 Contact with and (suspected) exposure to covid-19: Secondary | ICD-10-CM | POA: Diagnosis not present

## 2020-08-13 DIAGNOSIS — Z01812 Encounter for preprocedural laboratory examination: Secondary | ICD-10-CM | POA: Diagnosis present

## 2020-08-13 LAB — SARS CORONAVIRUS 2 (TAT 6-24 HRS): SARS Coronavirus 2: NEGATIVE

## 2020-08-16 ENCOUNTER — Encounter (HOSPITAL_COMMUNITY)
Admission: RE | Disposition: A | Discharge status: Home / Self Care | Payer: Self-pay | Source: Home / Self Care | Attending: Ophthalmology

## 2020-08-16 ENCOUNTER — Ambulatory Visit (HOSPITAL_COMMUNITY): Payer: Medicare Other | Admitting: Anesthesiology

## 2020-08-16 ENCOUNTER — Encounter (HOSPITAL_COMMUNITY): Payer: Self-pay | Admitting: Ophthalmology

## 2020-08-16 ENCOUNTER — Ambulatory Visit (HOSPITAL_COMMUNITY)
Admission: RE | Admit: 2020-08-16 | Discharge: 2020-08-16 | Disposition: A | Discharge status: Home / Self Care | Payer: Medicare Other | Attending: Ophthalmology | Admitting: Ophthalmology

## 2020-08-16 DIAGNOSIS — Z87891 Personal history of nicotine dependence: Secondary | ICD-10-CM | POA: Diagnosis not present

## 2020-08-16 DIAGNOSIS — H25811 Combined forms of age-related cataract, right eye: Secondary | ICD-10-CM | POA: Insufficient documentation

## 2020-08-16 DIAGNOSIS — Z79899 Other long term (current) drug therapy: Secondary | ICD-10-CM | POA: Diagnosis not present

## 2020-08-16 DIAGNOSIS — K219 Gastro-esophageal reflux disease without esophagitis: Secondary | ICD-10-CM | POA: Insufficient documentation

## 2020-08-16 DIAGNOSIS — M549 Dorsalgia, unspecified: Secondary | ICD-10-CM | POA: Diagnosis not present

## 2020-08-16 DIAGNOSIS — M1990 Unspecified osteoarthritis, unspecified site: Secondary | ICD-10-CM | POA: Diagnosis not present

## 2020-08-16 HISTORY — PX: CATARACT EXTRACTION W/PHACO: SHX586

## 2020-08-16 SURGERY — PHACOEMULSIFICATION, CATARACT, WITH IOL INSERTION
Anesthesia: Monitor Anesthesia Care | Site: Eye | Laterality: Right

## 2020-08-16 MED ORDER — LIDOCAINE HCL (PF) 1 % IJ SOLN
INTRAOCULAR | Status: DC | PRN
  Administered 2020-08-16: 1 mL via OPHTHALMIC

## 2020-08-16 MED ORDER — BSS IO SOLN
INTRAOCULAR | Status: DC | PRN
  Administered 2020-08-16: 15 mL via INTRAOCULAR

## 2020-08-16 MED ORDER — SODIUM HYALURONATE 23 MG/ML IO SOLN
INTRAOCULAR | Status: DC | PRN
  Administered 2020-08-16: 0.6 mL via INTRAOCULAR

## 2020-08-16 MED ORDER — PHENYLEPHRINE-KETOROLAC 1-0.3 % IO SOLN
INTRAOCULAR | Status: DC | PRN
  Administered 2020-08-16: 500 mL via OPHTHALMIC

## 2020-08-16 MED ORDER — POVIDONE-IODINE 5 % OP SOLN
OPHTHALMIC | Status: DC | PRN
  Administered 2020-08-16: 1 via OPHTHALMIC

## 2020-08-16 MED ORDER — NEOMYCIN-POLYMYXIN-DEXAMETH 3.5-10000-0.1 OP SUSP
OPHTHALMIC | Status: DC | PRN
  Administered 2020-08-16: 1 [drp] via OPHTHALMIC

## 2020-08-16 MED ORDER — LIDOCAINE HCL 3.5 % OP GEL
1.0000 "application " | Freq: Once | OPHTHALMIC | Status: AC
  Administered 2020-08-16: 1 via OPHTHALMIC

## 2020-08-16 MED ORDER — PHENYLEPHRINE HCL 2.5 % OP SOLN
1.0000 [drp] | OPHTHALMIC | Status: AC | PRN
  Administered 2020-08-16 (×3): 1 [drp] via OPHTHALMIC

## 2020-08-16 MED ORDER — TETRACAINE HCL 0.5 % OP SOLN
1.0000 [drp] | OPHTHALMIC | Status: AC | PRN
  Administered 2020-08-16 (×3): 1 [drp] via OPHTHALMIC

## 2020-08-16 MED ORDER — PROVISC 10 MG/ML IO SOLN
INTRAOCULAR | Status: DC | PRN
  Administered 2020-08-16: 0.85 mL via INTRAOCULAR

## 2020-08-16 MED ORDER — CYCLOPENTOLATE-PHENYLEPHRINE 0.2-1 % OP SOLN
1.0000 [drp] | OPHTHALMIC | Status: AC | PRN
  Administered 2020-08-16 (×3): 1 [drp] via OPHTHALMIC

## 2020-08-16 SURGICAL SUPPLY — 12 items

## 2020-08-16 NOTE — Discharge Instructions (Signed)
Please discharge patient when stable, will follow up today with Dr. Elfreida Heggs at the [facility] [place] office immediately following discharge.  Leave shield in place until visit.  All paperwork with discharge instructions will be given at the office.  [facility] [place] Address:  [address]  , [place] [address]  

## 2020-08-16 NOTE — Anesthesia Postprocedure Evaluation (Signed)
Anesthesia Post Note  Patient: Bernard Hill  Procedure(s) Performed: CATARACT EXTRACTION PHACO AND INTRAOCULAR LENS PLACEMENT (IOC) (Right Eye)  Patient location during evaluation: Phase II Anesthesia Type: MAC Level of consciousness: awake and alert Pain management: pain level controlled Vital Signs Assessment: post-procedure vital signs reviewed and stable Respiratory status: spontaneous breathing, nonlabored ventilation and respiratory function stable Cardiovascular status: stable and blood pressure returned to baseline Postop Assessment: no apparent nausea or vomiting Anesthetic complications: no   No complications documented.   Last Vitals:  Vitals:   08/16/20 0841 08/16/20 1009  BP: (!) 146/85 138/88  Pulse: 60 (!) 59  Resp: 14 19  Temp: 36.7 C 36.8 C  SpO2: 95% 97%    Last Pain:  Vitals:   08/16/20 1009  TempSrc: Oral  PainSc: 0-No pain                 Talitha Givens

## 2020-08-16 NOTE — Transfer of Care (Signed)
Immediate Anesthesia Transfer of Care Note  Patient: Bernard Hill  Procedure(s) Performed: CATARACT EXTRACTION PHACO AND INTRAOCULAR LENS PLACEMENT (IOC) (Right Eye)  Patient Location: PACU  Anesthesia Type:MAC  Level of Consciousness: awake, alert  and oriented  Airway & Oxygen Therapy: Patient Spontanous Breathing  Post-op Assessment: Report given to RN, Post -op Vital signs reviewed and stable and Patient moving all extremities X 4  Post vital signs: Reviewed and stable  Last Vitals:  Vitals Value Taken Time  BP    Temp    Pulse    Resp    SpO2      Last Pain:  Vitals:   08/16/20 0841  TempSrc: Oral  PainSc: 0-No pain      Patients Stated Pain Goal: 5 (69/79/48 0165)  Complications: No complications documented.

## 2020-08-16 NOTE — Op Note (Signed)
Date of procedure: 08/16/20  Pre-operative diagnosis:  Visually significant combined form age-related cataract, Right Eye (H25.811)  Post-operative diagnosis:  Visually significant combined form age-related cataract, Right Eye (H25.811)  Procedure: Removal of cataract via phacoemulsification and insertion of intra-ocular lens Wynetta Emery and [employer] DCB00  +20.0D into the capsular bag of the Right Eye  Attending surgeon: Gerda Diss. Sueko Dimichele, MD, MA  Anesthesia: MAC, Topical Akten  Complications: None  Estimated Blood Loss: <32m (minimal)  Specimens: None  Implants: As above  Indications:  Visually significant age-related cataract, Right Eye  Procedure:  The patient was seen and identified in the pre-operative area. The operative eye was identified and dilated.  The operative eye was marked.  Topical anesthesia was administered to the operative eye.     The patient was then to the operative suite and placed in the supine position.  A timeout was performed confirming the patient, procedure to be performed, and all other relevant information.   The patient's face was prepped and draped in the usual fashion for intra-ocular surgery.  A lid speculum was placed into the operative eye and the surgical microscope moved into place and focused.  A superotemporal paracentesis was created using a 20 gauge paracentesis blade.  Shugarcaine was injected into the anterior chamber.  Viscoelastic was injected into the anterior chamber.  A temporal clear-corneal main wound incision was created using a 2.416mmicrokeratome.  A continuous curvilinear capsulorrhexis was initiated using an irrigating cystitome and completed using capsulorrhexis forceps.  Hydrodissection and hydrodeliniation were performed.  Viscoelastic was injected into the anterior chamber.  A phacoemulsification handpiece and a chopper as a second instrument were used to remove the nucleus and epinucleus. The irrigation/aspiration handpiece was  used to remove any remaining cortical material.   The capsular bag was reinflated with viscoelastic, checked, and found to be intact.  The intraocular lens was inserted into the capsular bag.  The irrigation/aspiration handpiece was used to remove any remaining viscoelastic.  The clear corneal wound and paracentesis wounds were then hydrated and checked with Weck-Cels to be watertight.  The lid-speculum was removed.  The drape was removed.  The patient's face was cleaned with a wet and dry 4x4.   Maxitrol was instilled in the eye. A clear shield was taped over the eye. The patient was taken to the post-operative care unit in good condition, having tolerated the procedure well.  Post-Op Instructions: [facility] and will receive all other orders and instructions.

## 2020-08-16 NOTE — Anesthesia Preprocedure Evaluation (Signed)
Anesthesia Evaluation  Patient identified by MRN, date of birth, ID band Patient awake    Reviewed: Allergy & Precautions, H&P , NPO status , Patient's Chart, lab work & pertinent test results, reviewed documented beta blocker date and time   Airway Mallampati: II  TM Distance: >3 FB Neck ROM: full    Dental no notable dental hx.    Pulmonary neg pulmonary ROS, former smoker,    Pulmonary exam normal breath sounds clear to auscultation       Cardiovascular Exercise Tolerance: Good negative cardio ROS   Rhythm:regular Rate:Normal     Neuro/Psych negative neurological ROS  negative psych ROS   GI/Hepatic Neg liver ROS, GERD  Medicated,  Endo/Other  negative endocrine ROS  Renal/GU negative Renal ROS  negative genitourinary   Musculoskeletal   Abdominal   Peds  Hematology negative hematology ROS (+)   Anesthesia Other Findings   Reproductive/Obstetrics negative OB ROS                             Anesthesia Physical Anesthesia Plan  ASA: II  Anesthesia Plan: MAC   Post-op Pain Management:    Induction:   PONV Risk Score and Plan:   Airway Management Planned:   Additional Equipment:   Intra-op Plan:   Post-operative Plan:   Informed Consent: I have reviewed the patients History and Physical, chart, labs and discussed the procedure including the risks, benefits and alternatives for the proposed anesthesia with the patient or authorized representative who has indicated his/her understanding and acceptance.     Dental Advisory Given  Plan Discussed with: CRNA  Anesthesia Plan Comments:         Anesthesia Quick Evaluation

## 2020-08-16 NOTE — Interval H&P Note (Signed)
History and Physical Interval Note:  08/16/2020 9:43 AM  Bernard Hill  has presented today for surgery, with the diagnosis of Nuclear sclerotic cataract - Right eye.  The various methods of treatment have been discussed with the patient and family. After consideration of risks, benefits and other options for treatment, the patient has consented to  Procedure(s) with comments: CATARACT EXTRACTION PHACO AND INTRAOCULAR LENS PLACEMENT (IOC) (Right) - CDE:  as a surgical intervention.  The patient's history has been reviewed, patient examined, no change in status, stable for surgery.  I have reviewed the patient's chart and labs.  Questions were answered to the patient's satisfaction.     [name]

## 2020-08-17 ENCOUNTER — Encounter (HOSPITAL_COMMUNITY): Payer: Self-pay | Admitting: Ophthalmology

## 2020-08-26 NOTE — H&P (Signed)
Surgical History & Physical  Patient Name: Bernard Hill DOB: 1956-10-02  Surgery: Cataract extraction with intraocular lens implant phacoemulsification; Left Eye  Surgeon: [name] MD Surgery Date:  09/06/2020 Pre-Op Date:  08/23/2020  HPI: A 11 Yr. old male patient The patient is returning after cataract post-op. The right eye is affected. Status post cataract post-op, which began 1 weeks ago: Since the last visit, the affected area feels improvement and is doing well. The patient's vision is improved and stable. Patient is following medication instructions. The patient experiences no flashes and no floaters. The patient complains of difficulty when seeing street signs. The left eye is affected. The episode is constant. The condition's severity is worsening. Symptoms occur when the patient is driving. Distance vision is worse than near. Pt states lights seem to starburst. HPI was performed by [name] .  Medical History: Cataracts Arthritis Arthritis, Back Pain  Review of Systems Negative Allergic/Immunologic Negative Cardiovascular Negative Constitutional Negative Endocrine Negative Eyes Negative Gastrointestinal Negative Genitourinary Negative Hemotologic/Lymphatic Negative Integumentary Negative Musculoskeletal Negative Neurological Negative Psychiatry Negative Respiratory  Social   Never smoked   Medication Prednisolone-gatiflox-bromfenac,  Vitamin D, Testosterone,   Sx/Procedures Phaco c IOL OD,   Drug Allergies   NKDA  History & Physical: Heent:  Cataract, Left eye NECK: supple without bruits LUNGS: lungs clear to auscultation CV: regular rate and rhythm Abdomen: soft and non-tender  Impression & Plan: Assessment: 1.  CATARACT EXTRACTION STATUS; Right Eye (Z98.41) 2.  INTRAOCULAR LENS IOL (Z96.1) 3.  NUCLEAR SCLEROSIS AGE RELATED; , Left Eye (H25.12) 4.  BLEPHARITIS; Right Upper Lid, Left Upper Lid, (H01.001, H01.004) 5.  DERMATOCHALASIS,  no surgery; Right Upper Lid, Left Upper Lid (H02.831, Y65.035)  Plan: 1.  1 week after cataract surgery. Doing well with improved vision and normal eye pressure. Call with any problems or concerns. Continue Gati-Brom-Pred 2x/day for 3 more weeks. 2.  Stable. Doing well since surgery Continue Post-op medications 3.  Cataract accounts for the patient's decreased vision. This visual impairment is not correctable with a tolerable change in glasses or contact lenses. Cataract surgery with an implantation of a new lens should significantly improve the visual and functional status of the patient. Discussed all risks, benefits, alternatives, and potential complications. Discussed the procedures and recovery. Patient desires to have surgery. A-scan ordered and performed today for intra-ocular lens calculations. The surgery will be performed in order to improve vision for driving, reading, and for eye examinations. Recommend phacoemulsification with intra-ocular lens. Recommend Dextenza for post-operative pain and inflammation. Left Eye. Surgery required to correct imbalance of vision. Dilates poorly - shugacaine by protocol. Omidira. 4.  Begin/continue lid scrubs. 5.  Asymptomatic, recommend observation for now. Findings, prognosis and treatment options reviewed.

## 2020-09-02 ENCOUNTER — Other Ambulatory Visit: Payer: Self-pay

## 2020-09-02 ENCOUNTER — Encounter (HOSPITAL_COMMUNITY): Payer: Self-pay

## 2020-09-02 ENCOUNTER — Encounter (HOSPITAL_COMMUNITY)
Admit: 2020-09-02 | Discharge: 2020-09-02 | Disposition: A | Discharge status: Home / Self Care | Payer: Medicare Other | Attending: Ophthalmology | Admitting: Ophthalmology

## 2020-09-03 ENCOUNTER — Other Ambulatory Visit (HOSPITAL_COMMUNITY)
Admit: 2020-09-03 | Discharge: 2020-09-03 | Disposition: A | Discharge status: Home / Self Care | Payer: Medicare Other | Attending: Ophthalmology | Admitting: Ophthalmology

## 2020-09-03 DIAGNOSIS — Z01812 Encounter for preprocedural laboratory examination: Secondary | ICD-10-CM | POA: Insufficient documentation

## 2020-09-03 DIAGNOSIS — Z20822 Contact with and (suspected) exposure to covid-19: Secondary | ICD-10-CM | POA: Insufficient documentation

## 2020-09-04 LAB — SARS CORONAVIRUS 2 (TAT 6-24 HRS): SARS Coronavirus 2: NEGATIVE

## 2020-09-06 ENCOUNTER — Ambulatory Visit (HOSPITAL_COMMUNITY): Payer: Medicare Other | Admitting: Anesthesiology

## 2020-09-06 ENCOUNTER — Ambulatory Visit (HOSPITAL_COMMUNITY)
Admission: RE | Admit: 2020-09-06 | Discharge: 2020-09-06 | Disposition: A | Discharge status: Home / Self Care | Payer: Medicare Other | Attending: Ophthalmology | Admitting: Ophthalmology

## 2020-09-06 ENCOUNTER — Encounter (HOSPITAL_COMMUNITY)
Admission: RE | Disposition: A | Discharge status: Home / Self Care | Payer: Self-pay | Source: Home / Self Care | Attending: Ophthalmology

## 2020-09-06 ENCOUNTER — Encounter (HOSPITAL_COMMUNITY): Payer: Self-pay | Admitting: Ophthalmology

## 2020-09-06 DIAGNOSIS — H01001 Unspecified blepharitis right upper eyelid: Secondary | ICD-10-CM | POA: Diagnosis not present

## 2020-09-06 DIAGNOSIS — M1990 Unspecified osteoarthritis, unspecified site: Secondary | ICD-10-CM | POA: Insufficient documentation

## 2020-09-06 DIAGNOSIS — Z9841 Cataract extraction status, right eye: Secondary | ICD-10-CM | POA: Insufficient documentation

## 2020-09-06 DIAGNOSIS — H01004 Unspecified blepharitis left upper eyelid: Secondary | ICD-10-CM | POA: Insufficient documentation

## 2020-09-06 DIAGNOSIS — Z87891 Personal history of nicotine dependence: Secondary | ICD-10-CM | POA: Diagnosis not present

## 2020-09-06 DIAGNOSIS — H25812 Combined forms of age-related cataract, left eye: Secondary | ICD-10-CM | POA: Insufficient documentation

## 2020-09-06 HISTORY — PX: CATARACT EXTRACTION W/PHACO: SHX586

## 2020-09-06 SURGERY — PHACOEMULSIFICATION, CATARACT, WITH IOL INSERTION
Anesthesia: Monitor Anesthesia Care | Site: Eye | Laterality: Left

## 2020-09-06 MED ORDER — TETRACAINE HCL 0.5 % OP SOLN
1.0000 [drp] | OPHTHALMIC | Status: AC | PRN
  Administered 2020-09-06 (×3): 1 [drp] via OPHTHALMIC

## 2020-09-06 MED ORDER — POVIDONE-IODINE 5 % OP SOLN
OPHTHALMIC | Status: DC | PRN
  Administered 2020-09-06: 1 via OPHTHALMIC

## 2020-09-06 MED ORDER — PHENYLEPHRINE-KETOROLAC 1-0.3 % IO SOLN
INTRAOCULAR | Status: DC | PRN
  Administered 2020-09-06: 500 mL via OPHTHALMIC

## 2020-09-06 MED ORDER — LACTATED RINGERS IV SOLN: INTRAVENOUS | Status: DC

## 2020-09-06 MED ORDER — LIDOCAINE HCL 3.5 % OP GEL
1.0000 "application " | Freq: Once | OPHTHALMIC | Status: AC
  Administered 2020-09-06: 1 via OPHTHALMIC

## 2020-09-06 MED ORDER — EPINEPHRINE PF 1 MG/ML IJ SOLN
INTRAMUSCULAR | Status: AC
  Filled 2020-09-06: qty 1

## 2020-09-06 MED ORDER — PHENYLEPHRINE-KETOROLAC 1-0.3 % IO SOLN
INTRAOCULAR | Status: AC
  Filled 2020-09-06: qty 4

## 2020-09-06 MED ORDER — LIDOCAINE HCL (PF) 1 % IJ SOLN
INTRAOCULAR | Status: DC | PRN
  Administered 2020-09-06: 1 mL via OPHTHALMIC

## 2020-09-06 MED ORDER — NEOMYCIN-POLYMYXIN-DEXAMETH 3.5-10000-0.1 OP SUSP
OPHTHALMIC | Status: DC | PRN
  Administered 2020-09-06: 1 [drp] via OPHTHALMIC

## 2020-09-06 MED ORDER — BSS IO SOLN
INTRAOCULAR | Status: DC | PRN
  Administered 2020-09-06: 15 mL via INTRAOCULAR

## 2020-09-06 MED ORDER — PHENYLEPHRINE HCL 2.5 % OP SOLN
1.0000 [drp] | OPHTHALMIC | Status: AC | PRN
  Administered 2020-09-06 (×3): 1 [drp] via OPHTHALMIC

## 2020-09-06 MED ORDER — CYCLOPENTOLATE-PHENYLEPHRINE 0.2-1 % OP SOLN
1.0000 [drp] | OPHTHALMIC | Status: AC | PRN
  Administered 2020-09-06 (×3): 1 [drp] via OPHTHALMIC

## 2020-09-06 MED ORDER — SODIUM HYALURONATE 23 MG/ML IO SOLN
INTRAOCULAR | Status: DC | PRN
  Administered 2020-09-06: 0.6 mL via INTRAOCULAR

## 2020-09-06 MED ORDER — PROVISC 10 MG/ML IO SOLN
INTRAOCULAR | Status: DC | PRN
  Administered 2020-09-06: 0.85 mL via INTRAOCULAR

## 2020-09-06 SURGICAL SUPPLY — 12 items
CLOTH BEACON ORANGE TIMEOUT ST (SAFETY) ×3 IMPLANT
EYE SHIELD UNIVERSAL CLEAR (GAUZE/BANDAGES/DRESSINGS) ×3 IMPLANT
GLOVE BIOGEL PI IND STRL 7.0 (GLOVE) ×2 IMPLANT
GLOVE BIOGEL PI INDICATOR 7.0 (GLOVE) ×4
LENS ALC ACRYL/TECN (Ophthalmic Related) ×3 IMPLANT
NEEDLE HYPO 18GX1.5 BLUNT FILL (NEEDLE) ×3 IMPLANT
PAD ARMBOARD 7.5X6 YLW CONV (MISCELLANEOUS) ×3 IMPLANT
SYR TB 1ML LL NO SAFETY (SYRINGE) ×3 IMPLANT
TAPE SURG TRANSPORE 1 IN (GAUZE/BANDAGES/DRESSINGS) ×1 IMPLANT
TAPE SURGICAL TRANSPORE 1 IN (GAUZE/BANDAGES/DRESSINGS) ×3
VISCOELASTIC ADDITIONAL (OPHTHALMIC RELATED) ×3 IMPLANT
WATER STERILE IRR 250ML POUR (IV SOLUTION) ×3 IMPLANT

## 2020-09-06 NOTE — Transfer of Care (Signed)
Immediate Anesthesia Transfer of Care Note  Patient: Bernard Hill  Procedure(s) Performed: CATARACT EXTRACTION PHACO AND INTRAOCULAR LENS PLACEMENT LEFT EYE (Left Eye)  Patient Location: PACU  Anesthesia Type:MAC  Level of Consciousness: awake, alert , oriented and patient cooperative  Airway & Oxygen Therapy: Patient Spontanous Breathing  Post-op Assessment: Report given to RN, Post -op Vital signs reviewed and stable and Patient moving all extremities X 4  Post vital signs: Reviewed and stable  Last Vitals:  Vitals Value Taken Time  BP    Temp    Pulse    Resp    SpO2      Last Pain:  Vitals:   09/06/20 1054  TempSrc: Oral  PainSc: 0-No pain      Patients Stated Pain Goal: 7 (60/10/93 2355)  Complications: No complications documented.

## 2020-09-06 NOTE — Anesthesia Postprocedure Evaluation (Signed)
Anesthesia Post Note  Patient: Bernard Hill  Procedure(s) Performed: CATARACT EXTRACTION PHACO AND INTRAOCULAR LENS PLACEMENT LEFT EYE (Left Eye)  Patient location during evaluation: PACU Anesthesia Type: MAC Level of consciousness: awake and alert Pain management: pain level controlled Vital Signs Assessment: post-procedure vital signs reviewed and stable Respiratory status: spontaneous breathing, nonlabored ventilation, respiratory function stable and patient connected to nasal cannula oxygen Cardiovascular status: stable and blood pressure returned to baseline Postop Assessment: no apparent nausea or vomiting Anesthetic complications: no   No complications documented.   Last Vitals:  Vitals:   09/06/20 1054  BP: 139/85  Resp: 14  Temp: 36.8 C  SpO2: 96%    Last Pain:  Vitals:   09/06/20 1054  TempSrc: Oral  PainSc: 0-No pain                 Cheryle Dark

## 2020-09-06 NOTE — Discharge Instructions (Signed)
Please discharge patient when stable, will follow up today with Dr. Wesam Gearhart at the [facility] [place] office immediately following discharge.  Leave shield in place until visit.  All paperwork with discharge instructions will be given at the office.  [facility] [place] Address:  [address]  [place], [place] [address]  

## 2020-09-06 NOTE — Op Note (Signed)
Date of procedure: 09/06/20  Pre-operative diagnosis: Visually significant age-related combined cataract, Left Eye (H25.812)  Post-operative diagnosis: Visually significant age-related combined cataract, Left Eye (H25.812)  Procedure: Removal of cataract via phacoemulsification and insertion of intra-ocular lens Wynetta Emery and [employer] DCB00  +20.5D into the capsular bag of the Left Eye  Attending surgeon: Gerda Diss. Miguel Medal, MD, MA  Anesthesia: MAC, Topical Akten  Complications: None  Estimated Blood Loss: <74m (minimal)  Specimens: None  Implants: As above  Indications:  Visually significant age-related cataract, Left Eye  Procedure:  The patient was seen and identified in the pre-operative area. The operative eye was identified and dilated.  The operative eye was marked.  Topical anesthesia was administered to the operative eye.     The patient was then to the operative suite and placed in the supine position.  A timeout was performed confirming the patient, procedure to be performed, and all other relevant information.   The patient's face was prepped and draped in the usual fashion for intra-ocular surgery.  A lid speculum was placed into the operative eye and the surgical microscope moved into place and focused.  An inferotemporal paracentesis was created using a 20 gauge paracentesis blade.  Shugarcaine was injected into the anterior chamber.  Viscoelastic was injected into the anterior chamber.  A temporal clear-corneal main wound incision was created using a 2.46mmicrokeratome.  A continuous curvilinear capsulorrhexis was initiated using an irrigating cystitome and completed using capsulorrhexis forceps.  Hydrodissection and hydrodeliniation were performed.  Viscoelastic was injected into the anterior chamber.  A phacoemulsification handpiece and a chopper as a second instrument were used to remove the nucleus and epinucleus. The irrigation/aspiration handpiece was used to remove  any remaining cortical material.   The capsular bag was reinflated with viscoelastic, checked, and found to be intact.  The intraocular lens was inserted into the capsular bag.  The irrigation/aspiration handpiece was used to remove any remaining viscoelastic.  The clear corneal wound and paracentesis wounds were then hydrated and checked with Weck-Cels to be watertight.  The lid-speculum was removed.  The drape was removed.  The patient's face was cleaned with a wet and dry 4x4.   Maxitrol was instilled in the eye. A clear shield was taped over the eye. The patient was taken to the post-operative care unit in good condition, having tolerated the procedure well.  Post-Op Instructions: [facility] and will receive all other orders and instructions.

## 2020-09-06 NOTE — Anesthesia Preprocedure Evaluation (Signed)
Anesthesia Evaluation  Patient identified by MRN, date of birth, ID band Patient awake    Reviewed: Allergy & Precautions, NPO status , Patient's Chart, lab work & pertinent test results  History of Anesthesia Complications Negative for: history of anesthetic complications  Airway Mallampati: II  TM Distance: >3 FB Neck ROM: Full    Dental  (+) Dental Advisory Given, Missing   Pulmonary former smoker,    Pulmonary exam normal breath sounds clear to auscultation       Cardiovascular Exercise Tolerance: Good Normal cardiovascular exam Rhythm:Regular Rate:Normal     Neuro/Psych negative neurological ROS  negative psych ROS   GI/Hepatic Neg liver ROS, GERD  ,  Endo/Other  negative endocrine ROS  Renal/GU negative Renal ROS  negative genitourinary   Musculoskeletal  (+) Arthritis ,   Abdominal   Peds  Hematology   Anesthesia Other Findings   Reproductive/Obstetrics                             Anesthesia Physical Anesthesia Plan  ASA: II  Anesthesia Plan: MAC   Post-op Pain Management:    Induction:   PONV Risk Score and Plan:   Airway Management Planned: Nasal Cannula and Natural Airway  Additional Equipment:   Intra-op Plan:   Post-operative Plan:   Informed Consent: I have reviewed the patients History and Physical, chart, labs and discussed the procedure including the risks, benefits and alternatives for the proposed anesthesia with the patient or authorized representative who has indicated his/her understanding and acceptance.     Dental advisory given  Plan Discussed with: Surgeon and CRNA  Anesthesia Plan Comments:         Anesthesia Quick Evaluation

## 2020-09-06 NOTE — Interval H&P Note (Signed)
History and Physical Interval Note:  09/06/2020 11:29 AM  Bernard Hill  has presented today for surgery, with the diagnosis of Nuclear sclerotic cataract - Left eye.  The various methods of treatment have been discussed with the patient and family. After consideration of risks, benefits and other options for treatment, the patient has consented to  Procedure(s) with comments: CATARACT EXTRACTION PHACO AND INTRAOCULAR LENS PLACEMENT (IOC) (Left) - CDE:  as a surgical intervention.  The patient's history has been reviewed, patient examined, no change in status, stable for surgery.  I have reviewed the patient's chart and labs.  Questions were answered to the patient's satisfaction.     [name]

## 2020-09-07 ENCOUNTER — Encounter (HOSPITAL_COMMUNITY): Payer: Self-pay | Admitting: Ophthalmology

## 2020-11-02 ENCOUNTER — Ambulatory Visit (INDEPENDENT_AMBULATORY_CARE_PROVIDER_SITE_OTHER): Payer: Medicare Other | Admitting: Nurse Practitioner

## 2020-11-02 ENCOUNTER — Other Ambulatory Visit: Payer: Self-pay

## 2020-11-02 ENCOUNTER — Encounter: Payer: Self-pay | Admitting: Nurse Practitioner

## 2020-11-02 VITALS — BP 139/87 | HR 86 | Temp 97.8°F | Resp 20 | Ht 69.0 in | Wt 310.4 lb

## 2020-11-02 DIAGNOSIS — Z6841 Body Mass Index (BMI) 40.0 and over, adult: Secondary | ICD-10-CM

## 2020-11-02 DIAGNOSIS — E8881 Metabolic syndrome: Secondary | ICD-10-CM | POA: Diagnosis not present

## 2020-11-02 DIAGNOSIS — Z23 Encounter for immunization: Secondary | ICD-10-CM

## 2020-11-02 MED ORDER — OZEMPIC (0.25 OR 0.5 MG/DOSE) 2 MG/1.5ML ~~LOC~~ SOPN: 0.5000 mg | PEN_INJECTOR | SUBCUTANEOUS | 0 refills | Status: DC

## 2020-11-02 NOTE — Progress Notes (Signed)
Established Patient Office Visit  Subjective:  Patient ID: Bernard Hill, male    DOB: 1956/04/16  Age: 64 y.o. MRN: [id number]  CC: No chief complaint on file.   HPI Bernard Hill presents for   Past Medical History:  Diagnosis Date   Arthritis    BPH (benign prostatic hypertrophy)    Chronic back pain    History of kidney stones    Hypogonadism in male     Past Surgical History:  Procedure Laterality Date   CATARACT EXTRACTION W/PHACO Right 08/16/2020   Procedure: CATARACT EXTRACTION PHACO AND INTRAOCULAR LENS PLACEMENT ([place]);  Surgeon: [name], MD;  Location: [facility];  Service: Ophthalmology;  Laterality: Right;  CDE: 6.39   CATARACT EXTRACTION W/PHACO Left 09/06/2020   Procedure: CATARACT EXTRACTION PHACO AND INTRAOCULAR LENS PLACEMENT LEFT EYE;  Surgeon: [name], MD;  Location: [facility];  Service: Ophthalmology;  Laterality: Left;  CDE: 5.96   COLONOSCOPY     COLONOSCOPY WITH PROPOFOL N/A 04/19/2018   Procedure: COLONOSCOPY WITH PROPOFOL;  Surgeon: Rogene [place], MD;  Location: [facility];  Service: Endoscopy;  Laterality: N/A;  12:25   HAND SURGERY Right 2017   palm near middle finger    LUMBAR FUSION  90,94,96   x3   POLYPECTOMY  04/19/2018   Procedure: POLYPECTOMY;  Surgeon: Rogene [place], MD;  Location: [facility];  Service: Endoscopy;;  colon   REPAIR EXTENSOR TENDON Right 09/01/2014   Procedure: EXPLORATION REPAIR FLEXOR SUPERFICIAL TENDON RIGHT MIDDLE FINGER  ;  Surgeon: Daryll Brod, MD;  Location: [place];  Service: Orthopedics;  Laterality: Right;    Family History  Problem Relation Age of Onset   Cancer Mother        breast    Heart disease Mother    Hypertension Sister    Cancer Brother        bladder and colon    Asthma Daughter    Hypertension Daughter     Social History   Socioeconomic History   Marital status: Married    Spouse name: [name]   Number of children: 3   Years of  education: Not on file   Highest education level: Not on file  Occupational History   Occupation: truck driver    Comment: part time   Tobacco Use   Smoking status: Former Smoker    Packs/day: 1.00    Years: 10.00    Pack years: 10.00    Types: Cigarettes    Quit date: 08/29/1979    Years since quitting: 41.2   Smokeless tobacco: Never Used  Vaping Use   Vaping Use: Never used  Substance and Sexual Activity   Alcohol use: No   Drug use: No   Sexual activity: Not Currently    Birth control/protection: None  Other Topics Concern   Not on file  Social History Narrative   Not on file   Social Determinants of Health   Financial Resource Strain:    Difficulty of Paying Living Expenses: Not on file  Food Insecurity:    Worried About Charity fundraiser in the Last Year: Not on file   [employer] of Food in the Last Year: Not on file  Transportation Needs:    Lack of Transportation (Medical): Not on file   Lack of Transportation (Non-Medical): Not on file  Physical Activity:    Days of Exercise per Week: Not on file   Minutes of Exercise per Session:  Not on file  Stress:    Feeling of Stress : Not on file  Social Connections:    Frequency of Communication with Friends and Family: Not on file   Frequency of Social Gatherings with Friends and Family: Not on file   Attends Religious Services: Not on file   Active Member of Clubs or Organizations: Not on file   Attends Archivist Meetings: Not on file   Marital Status: Not on file  Intimate Partner Violence:    Fear of Current or Ex-Partner: Not on file   Emotionally Abused: Not on file   Physically Abused: Not on file   Sexually Abused: Not on file    Outpatient Medications Prior to Visit  Medication Sig Dispense Refill   ANDROGEL PUMP 20.25 MG/ACT (1.62%) GEL Place 3 application onto the skin daily.  5   cholecalciferol (VITAMIN D) 1000 units tablet Take 1,000 Units by mouth daily.       diclofenac Sodium (VOLTAREN) 1 % GEL Apply 2 g topically daily.      tadalafil (CIALIS) 5 MG tablet Take 5 mg by mouth daily.      No facility-administered medications prior to visit.    No Known Allergies  ROS Review of Systems  All other systems reviewed and are negative.     Objective:    Physical Exam Vitals reviewed.  Constitutional:      Appearance: He is obese.     Comments: BMI 45.83 kg/mm, patient is 310lb and 6 oz  HENT:     Head: Normocephalic.  Eyes:     Conjunctiva/sclera: Conjunctivae normal.  Cardiovascular:     Rate and Rhythm: Normal rate.     Pulses: Normal pulses.     Heart sounds: Normal heart sounds.  Pulmonary:     Effort: Pulmonary effort is normal.     Breath sounds: Normal breath sounds.  Abdominal:     General: Bowel sounds are normal.  Musculoskeletal:        General: Normal range of motion.     Cervical back: Normal range of motion.  Skin:    General: Skin is warm.  Neurological:     Mental Status: He is alert and oriented to person, place, and time.     BP 139/87    Pulse 86    Temp 97.8 F (36.6 C) (Temporal)    Resp 20    Ht 5\' 9"  (1.753 m)    Wt (!) 310 lb 6 oz (140.8 kg)    SpO2 95%    BMI 45.83 kg/m  Wt Readings from Last 3 Encounters:  11/02/20 (!) 310 lb 6 oz (140.8 kg)  08/03/20 299 lb (135.6 kg)  12/29/19 (!) 302 lb (137 kg)     Health Maintenance Due  Topic Date Due   COVID-19 Vaccine (1) Never done    There are no preventive care reminders to display for this patient.  Lab Results  Component Value Date   TSH 3.842 12/30/2007   Lab Results  Component Value Date   WBC 7.6 08/03/2020   HGB 16.5 08/03/2020   HCT 50.4 08/03/2020   MCV 83 08/03/2020   PLT 195 08/03/2020   Lab Results  Component Value Date   NA 141 08/03/2020   K 4.5 08/03/2020   CO2 25 08/03/2020   GLUCOSE 87 08/03/2020   BUN 14 08/03/2020   CREATININE 0.74 (L) 08/03/2020   BILITOT 0.6 08/03/2020   ALKPHOS 60 08/03/2020   AST 14  08/03/2020   ALT 21 08/03/2020   PROT 6.6 08/03/2020   ALBUMIN 4.4 08/03/2020   CALCIUM 9.6 08/03/2020   ANIONGAP 9 04/11/2018   Lab Results  Component Value Date   CHOL 167 08/03/2020   Lab Results  Component Value Date   HDL 40 08/03/2020   Lab Results  Component Value Date   LDLCALC 104 (H) 08/03/2020   Lab Results  Component Value Date   TRIG 130 08/03/2020   Lab Results  Component Value Date   CHOLHDL 4.2 08/03/2020   No results found for: HGBA1C    Assessment & Plan:   Problem List Items Addressed This Visit      Other   BMI 45.0-49.9, adult ([place])    Started patient on Ozempic 0.25 mg daily, will change dose to 0.5 mg as tolerated Follow-up in 4 weeks. Provided education to patient with printed handouts given.  Rx sent to pharmacy        Relevant Medications   Semaglutide,0.25 or 0.5MG /DOS, (OZEMPIC, 0.25 OR 0.5 MG/DOSE,) 2 XK/5.5VZ SOPN   Metabolic syndrome - Primary    Not well controlled.  Started patient on Ozempic 0.5 mg weekly.  Rx sent to pharmacy.  Provided education to patient with printed handouts given. Continue healthy diet and exercise      Relevant Medications   Semaglutide,0.25 or 0.5MG /DOS, (OZEMPIC, 0.25 OR 0.5 MG/DOSE,) 2 MG/1.5ML SOPN    Other Visit Diagnoses    Need for immunization against influenza       Relevant Orders   Flu Vaccine QUAD High Dose(Fluad) (Completed)      Meds ordered this encounter  Medications   Semaglutide,0.25 or 0.5MG /DOS, (OZEMPIC, 0.25 OR 0.5 MG/DOSE,) 2 MG/1.5ML SOPN    Sig: Inject 0.5 mg into the skin once a week.    Dispense:  1.5 mL    Refill:  0    Order Specific Question:   Supervising Provider    Answer:   Caryl Pina A [[id number]]    Follow-up: Return in about 4 weeks (around 11/30/2020).    Ivy Lynn, NP

## 2020-11-02 NOTE — Assessment & Plan Note (Signed)
Started patient on Ozempic 0.25 mg daily, will change dose to 0.5 mg as tolerated Follow-up in 4 weeks. Provided education to patient with printed handouts given.  Rx sent to pharmacy

## 2020-11-02 NOTE — Patient Instructions (Signed)
Metabolic Syndrome Metabolic syndrome occurs when you have a combination of three or more factors that increase your chances of developing cardiovascular disease and diabetes. These factors include:  High fasting blood sugar (glucose).  High blood triglyceride level.  High blood pressure.  Low levels of high-density lipoprotein (HDL) blood cholesterol.  Having a waist measurement that is: ? More than 40 inches in men. ? More than 35 inches in women. Metabolic syndrome is sometimes called insulin resistance syndrome or syndrome X. What are the causes? The exact cause of this condition is not known. It may be related to a combination of the factors that were passed down from your parents (genes) and things that you do, eat, and drink (lifestyle choices). What increases the risk? You are more likely to develop this condition if you:  Eat a diet high in calories and saturated fat.  Do not exercise regularly.  Are obese.  Have a family history of type 2 diabetes mellitus.  Have insulin resistance.  Have a history of gestational diabetes during a previous pregnancy.  Have conditions such as cardiovascular disease, nonalcoholic fatty liver disease, or polycystic ovary syndrome (PCOS).  Are older. The risk increases with age.  Use any tobacco products, including cigarettes, chewing tobacco, or e-cigarettes. What are the signs or symptoms? Metabolic syndrome has no specific symptoms. Having abnormal blood test results may be the only signs of metabolic syndrome. How is this diagnosed? This condition may be diagnosed based on:  Your blood pressure measurements.  Your waist measurement.  Blood tests.  Your personal and family medical history. How is this treated? Treatment may include:  Lifestyle changes to reduce your risk for heart disease, stroke, and diabetes, such as: ? Exercise. ? Weight loss. ? Eating a healthy diet. ? Stopping tobacco and nicotine  use.  Medicines that: ? Help your body maintain normal blood glucose levels. ? Lower your blood pressure and your blood triglyceride levels. Follow these instructions at home:      Take over-the-counter and prescription medicines only as told by your health care provider.  Exercise regularly, as told by your health care provider.  Eat a healthy diet that includes fresh fruits and vegetables, whole grains, lean proteins, and low-fat or nonfat dairy products.  Maintain a healthy weight. Work with your health care provider to lose weight safely, if needed.  Do not use any products that contain nicotine or tobacco, such as cigarettes and e-cigarettes. If you need help quitting, ask your health care provider.  If directed, measure your waist regularly and write down the measurements. To measure your waist: ? Stand up straight. ? Breathe out. ? Wrap a measuring tape around the part of your waist that is just above your hip bones. ? Read and write down the measurement.  Keep all follow-up visits as told by your health care provider. This is important. Contact a health care provider if:  You feel very tired.  You are extremely thirsty.  You urinate a lot more than usual.  Your waist gets bigger.  You have headaches that do not go away. Get help right away if:  You suddenly develop any of the following: ? Dizziness. ? Blurry vision. ? Trouble speaking. ? Trouble swallowing. ? Weakness in an arm or leg. ? Chest pain. ? Trouble breathing.  Your heartbeat feels abnormal.  You faint. Summary  Metabolic syndrome occurs when you have a combination of three or more factors that increase your chances of developing cardiovascular disease and  diabetes.  These factors include a high fasting blood sugar (glucose), high blood triglyceride level, high blood pressure, low levels of high-density lipoprotein (HDL) blood cholesterol, and a waist measurement that is more than 40 inches in  men or more than 35 inches in women.  Metabolic syndrome has no specific symptoms. Having abnormal blood test results may be the only signs of metabolic syndrome.  Treatment may include lifestyle changes and medicine to reduce your risk for heart disease, stroke, and diabetes. This information is not intended to replace advice given to you by your health care provider. Make sure you discuss any questions you have with your health care provider. Document Revised: 12/24/2017 Document Reviewed: 12/24/2017 Elsevier Patient Education  2020 [place] [employer] Calories are units of energy. Your body needs a certain amount of calories from food to keep you going throughout the day. When you eat more calories than your body needs, your body stores the extra calories as fat. When you eat fewer calories than your body needs, your body burns fat to get the energy it needs. Calorie counting means keeping track of how many calories you eat and drink each day. Calorie counting can be helpful if you need to lose weight. If you make sure to eat fewer calories than your body needs, you should lose weight. Ask your health care provider what a healthy weight is for you. For calorie counting to work, you will need to eat the right number of calories in a day in order to lose a healthy amount of weight per week. A dietitian can help you determine how many calories you need in a day and will give you suggestions on how to reach your calorie goal.  A healthy amount of weight to lose per week is usually 1-2 lb (0.5-0.9 kg). This usually means that your daily calorie intake should be reduced by 500-750 calories.  Eating 1,200 - 1,500 calories per day can help most women lose weight.  Eating 1,500 - 1,800 calories per day can help most men lose weight. What is my plan? My goal is to have __________ calories per day. If I have this many calories per day, I should lose around __________ pounds  per week. What do I need to know about calorie counting? In order to meet your daily calorie goal, you will need to:  Find out how many calories are in each food you would like to eat. Try to do this before you eat.  Decide how much of the food you plan to eat.  Write down what you ate and how many calories it had. Doing this is called keeping a food log. To successfully lose weight, it is important to balance calorie counting with a healthy lifestyle that includes regular activity. Aim for 150 minutes of moderate exercise (such as walking) or 75 minutes of vigorous exercise (such as running) each week. Where do I find calorie information?  The number of calories in a food can be found on a Nutrition Facts label. If a food does not have a Nutrition Facts label, try to look up the calories online or ask your dietitian for help. Remember that calories are listed per serving. If you choose to have more than one serving of a food, you will have to multiply the calories per serving by the amount of servings you plan to eat. For example, the label on a package of bread might say that a serving size is 1 slice  and that there are 90 calories in a serving. If you eat 1 slice, you will have eaten 90 calories. If you eat 2 slices, you will have eaten 180 calories. How do I keep a food log? Immediately after each meal, record the following information in your food log:  What you ate. Don't forget to include toppings, sauces, and other extras on the food.  How much you ate. This can be measured in cups, ounces, or number of items.  How many calories each food and drink had.  The total number of calories in the meal. Keep your food log near you, such as in a small notebook in your pocket, or use a mobile app or website. Some programs will calculate calories for you and show you how many calories you have left for the day to meet your goal. What are some calorie counting tips?   Use your calories on  foods and drinks that will fill you up and not leave you hungry: ? Some examples of foods that fill you up are nuts and nut butters, vegetables, lean proteins, and high-fiber foods like whole grains. High-fiber foods are foods with more than 5 g fiber per serving. ? Drinks such as sodas, specialty coffee drinks, alcohol, and juices have a lot of calories, yet do not fill you up.  Eat nutritious foods and avoid empty calories. Empty calories are calories you get from foods or beverages that do not have many vitamins or protein, such as candy, sweets, and soda. It is better to have a nutritious high-calorie food (such as an avocado) than a food with few nutrients (such as a bag of chips).  Know how many calories are in the foods you eat most often. This will help you calculate calorie counts faster.  Pay attention to calories in drinks. Low-calorie drinks include water and unsweetened drinks.  Pay attention to nutrition labels for "low fat" or "fat free" foods. These foods sometimes have the same amount of calories or more calories than the full fat versions. They also often have added sugar, starch, or salt, to make up for flavor that was removed with the fat.  Find a way of tracking calories that works for you. Get creative. Try different apps or programs if writing down calories does not work for you. What are some portion control tips?  Know how many calories are in a serving. This will help you know how many servings of a certain food you can have.  Use a measuring cup to measure serving sizes. You could also try weighing out portions on a kitchen scale. With time, you will be able to estimate serving sizes for some foods.  Take some time to put servings of different foods on your favorite plates, bowls, and cups so you know what a serving looks like.  Try not to eat straight from a bag or box. Doing this can lead to overeating. Put the amount you would like to eat in a cup or on a plate to  make sure you are eating the right portion.  Use smaller plates, glasses, and bowls to prevent overeating.  Try not to multitask (for example, watch TV or use your computer) while eating. If it is time to eat, sit down at a table and enjoy your food. This will help you to know when you are full. It will also help you to be aware of what you are eating and how much you are eating. What are tips for  following this plan? Reading food labels  Check the calorie count compared to the serving size. The serving size may be smaller than what you are used to eating.  Check the source of the calories. Make sure the food you are eating is high in vitamins and protein and low in saturated and trans fats. Shopping  Read nutrition labels while you shop. This will help you make healthy decisions before you decide to purchase your food.  Make a grocery list and stick to it. Cooking  Try to cook your favorite foods in a healthier way. For example, try baking instead of frying.  Use low-fat dairy products. Meal planning  Use more fruits and vegetables. Half of your plate should be fruits and vegetables.  Include lean proteins like poultry and fish. How do I count calories when eating out?  Ask for smaller portion sizes.  Consider sharing an entree and sides instead of getting your own entree.  If you get your own entree, eat only half. Ask for a box at the beginning of your meal and put the rest of your entree in it so you are not tempted to eat it.  If calories are listed on the menu, choose the lower calorie options.  Choose dishes that include vegetables, fruits, whole grains, low-fat dairy products, and lean protein.  Choose items that are boiled, broiled, grilled, or steamed. Stay away from items that are buttered, battered, fried, or served with cream sauce. Items labeled "crispy" are usually fried, unless stated otherwise.  Choose water, low-fat milk, unsweetened iced tea, or other drinks  without added sugar. If you want an alcoholic beverage, choose a lower calorie option such as a glass of wine or light beer.  Ask for dressings, sauces, and syrups on the side. These are usually high in calories, so you should limit the amount you eat.  If you want a salad, choose a garden salad and ask for grilled meats. Avoid extra toppings like bacon, cheese, or fried items. Ask for the dressing on the side, or ask for olive oil and vinegar or lemon to use as dressing.  Estimate how many servings of a food you are given. For example, a serving of cooked rice is  cup or about the size of half a baseball. Knowing serving sizes will help you be aware of how much food you are eating at restaurants. The list below tells you how big or small some common portion sizes are based on everyday objects: ? 1 oz--4 stacked dice. ? 3 oz--1 deck of cards. ? 1 tsp--1 die. ? 1 Tbsp-- a ping-pong ball. ? 2 Tbsp--1 ping-pong ball. ?  cup-- baseball. ? 1 cup--1 baseball. Summary  Calorie counting means keeping track of how many calories you eat and drink each day. If you eat fewer calories than your body needs, you should lose weight.  A healthy amount of weight to lose per week is usually 1-2 lb (0.5-0.9 kg). This usually means reducing your daily calorie intake by 500-750 calories.  The number of calories in a food can be found on a Nutrition Facts label. If a food does not have a Nutrition Facts label, try to look up the calories online or ask your dietitian for help.  Use your calories on foods and drinks that will fill you up, and not on foods and drinks that will leave you hungry.  Use smaller plates, glasses, and bowls to prevent overeating. This information is not intended to replace advice  given to you by your health care provider. Make sure you discuss any questions you have with your health care provider. Document Revised: 08/30/2018 Document Reviewed: 11/10/2016 Elsevier Patient Education   [place].

## 2020-11-02 NOTE — Assessment & Plan Note (Signed)
Not well controlled.  Started patient on Ozempic 0.5 mg weekly.  Rx sent to pharmacy.  Provided education to patient with printed handouts given. Continue healthy diet and exercise

## 2020-11-30 ENCOUNTER — Ambulatory Visit (INDEPENDENT_AMBULATORY_CARE_PROVIDER_SITE_OTHER): Payer: Medicare Other | Admitting: Pharmacist

## 2020-11-30 ENCOUNTER — Encounter: Payer: Self-pay | Admitting: Pharmacist

## 2020-11-30 ENCOUNTER — Other Ambulatory Visit: Payer: Self-pay

## 2020-11-30 VITALS — Wt 311.0 lb

## 2020-11-30 DIAGNOSIS — E8881 Metabolic syndrome: Secondary | ICD-10-CM | POA: Diagnosis not present

## 2020-11-30 DIAGNOSIS — Z6841 Body Mass Index (BMI) 40.0 and over, adult: Secondary | ICD-10-CM

## 2020-12-06 ENCOUNTER — Encounter: Payer: Self-pay | Admitting: Pharmacist

## 2020-12-06 NOTE — Progress Notes (Signed)
    11/30/2020 Name: Bernard Hill MRN: [id number] DOB: 1956/05/04   S:  60 yoM presents for weight loss evaluation, education, and management. He was recently prescribed Ozempic for weight loss, however he would like to learn more about the drug and how to eat better Patient was referred and last seen by Primary Care Provider on 11/02/20.  Insurance coverage/medication affordability: [employer] medicare    Patient reported dietary habits: Eats 3 meals/day Discussed meal planning options and Plate method for healthy eating . Avoid sugary drinks and desserts . Incorporate balanced protein, non starchy veggies, 1 serving of carbohydrate with each meal . Increase water intake . Increase physical activity as able  The patient is asked to make an attempt to improve diet and exercise patterns to aid in medical management of this problem.  Patient-reported exercise habits:  N/a; encouraged  O:  Weight 311lbs    Lipid Panel     Component Value Date/Time   CHOL 167 08/03/2020 1539   TRIG 130 08/03/2020 1539   HDL 40 08/03/2020 1539   CHOLHDL 4.2 08/03/2020 1539   LDLCALC 104 (H) 08/03/2020 1539    A/P:  -Continue Ozempic; increase to 0.5mg  sq weekly this week for weight loss/metabolic syndrome.  Will continue to titrate--plan for increase to 1mg  after 4-6 weeks of 0.5mg  sq weekly dosing.  Patient has Medicare, therefore we cannot get Saxenda or Wergovy covered (no weight loss mediations are covered under Medicare);  Denies history of thyroid cancer  Tolerating medication at this time  -Recommended lifestyle interventions, dietary effects on weight loss in addition to medication  Written patient instructions provided.  Total time in face to face counseling 25 minutes.   Regina Eck, PharmD, BCPS Clinical Pharmacist, [place]  II Phone [phone]

## 2021-01-24 ENCOUNTER — Other Ambulatory Visit: Payer: Self-pay | Admitting: *Deleted

## 2021-01-24 DIAGNOSIS — E8881 Metabolic syndrome: Secondary | ICD-10-CM

## 2021-01-24 MED ORDER — OZEMPIC (0.25 OR 0.5 MG/DOSE) 2 MG/1.5ML ~~LOC~~ SOPN: 0.5000 mg | PEN_INJECTOR | SUBCUTANEOUS | 0 refills | Status: DC

## 2021-01-25 DIAGNOSIS — M1712 Unilateral primary osteoarthritis, left knee: Secondary | ICD-10-CM | POA: Diagnosis not present

## 2021-01-25 DIAGNOSIS — M25562 Pain in left knee: Secondary | ICD-10-CM | POA: Diagnosis not present

## 2021-01-25 DIAGNOSIS — M2392 Unspecified internal derangement of left knee: Secondary | ICD-10-CM | POA: Diagnosis not present

## 2021-01-25 DIAGNOSIS — M9905 Segmental and somatic dysfunction of pelvic region: Secondary | ICD-10-CM | POA: Diagnosis not present

## 2021-01-25 DIAGNOSIS — M9903 Segmental and somatic dysfunction of lumbar region: Secondary | ICD-10-CM | POA: Diagnosis not present

## 2021-01-25 DIAGNOSIS — M62838 Other muscle spasm: Secondary | ICD-10-CM | POA: Diagnosis not present

## 2021-01-25 DIAGNOSIS — M9904 Segmental and somatic dysfunction of sacral region: Secondary | ICD-10-CM | POA: Diagnosis not present

## 2021-01-31 DIAGNOSIS — M2392 Unspecified internal derangement of left knee: Secondary | ICD-10-CM | POA: Diagnosis not present

## 2021-01-31 DIAGNOSIS — M9904 Segmental and somatic dysfunction of sacral region: Secondary | ICD-10-CM | POA: Diagnosis not present

## 2021-01-31 DIAGNOSIS — M9903 Segmental and somatic dysfunction of lumbar region: Secondary | ICD-10-CM | POA: Diagnosis not present

## 2021-01-31 DIAGNOSIS — M1712 Unilateral primary osteoarthritis, left knee: Secondary | ICD-10-CM | POA: Diagnosis not present

## 2021-01-31 DIAGNOSIS — M62838 Other muscle spasm: Secondary | ICD-10-CM | POA: Diagnosis not present

## 2021-01-31 DIAGNOSIS — M9905 Segmental and somatic dysfunction of pelvic region: Secondary | ICD-10-CM | POA: Diagnosis not present

## 2021-01-31 DIAGNOSIS — M25562 Pain in left knee: Secondary | ICD-10-CM | POA: Diagnosis not present

## 2021-02-02 ENCOUNTER — Other Ambulatory Visit: Payer: Self-pay

## 2021-02-02 ENCOUNTER — Encounter: Payer: Self-pay | Admitting: Family Medicine

## 2021-02-02 ENCOUNTER — Ambulatory Visit (INDEPENDENT_AMBULATORY_CARE_PROVIDER_SITE_OTHER): Payer: Medicare Other | Admitting: Family Medicine

## 2021-02-02 VITALS — BP 136/88 | HR 71 | Temp 98.0°F | Ht 69.0 in | Wt 308.8 lb

## 2021-02-02 DIAGNOSIS — E8881 Metabolic syndrome: Secondary | ICD-10-CM | POA: Diagnosis not present

## 2021-02-02 DIAGNOSIS — Z6841 Body Mass Index (BMI) 40.0 and over, adult: Secondary | ICD-10-CM | POA: Diagnosis not present

## 2021-02-02 MED ORDER — OZEMPIC (0.25 OR 0.5 MG/DOSE) 2 MG/1.5ML ~~LOC~~ SOPN: 1.0000 mg | PEN_INJECTOR | SUBCUTANEOUS | 3 refills | Status: DC

## 2021-02-02 NOTE — Progress Notes (Signed)
Assessment & Plan:  1. Metabolic syndrome - Tolerating Ozempic well, therefore increasing to 1 mg once weekly.  Education provided on metabolic syndrome. - WIOXBDZHGDJ,2.42 or 0.5MG /DOS, (OZEMPIC, 0.25 OR 0.5 MG/DOSE,) 2 MG/1.5ML SOPN; Inject 1 mg into the skin once a week.  Dispense: 3 mL; Refill: 3  2. BMI 45.0-49.9, adult [facility]) - Patient has lost 2 pounds since starting on Ozempic.  Encouraged diet and exercise.   Return in about 3 months (around 05/02/2021) for follow-up of chronic medication conditions.  Hendricks Limes, MSN, APRN, FNP-C [facility]  Subjective:    Patient ID: Bernard Hill, male    DOB: Nov 18, 1956, 65 y.o.   MRN: [id number]  Patient Care Team: Loman Brooklyn, FNP as PCP - General (Family Medicine) Lavera Guise, [facility] (Pharmacist)   Chief Complaint:  Chief Complaint  Patient presents with  . metabolic syndrome    3 month follow up of chronic medical conditions     HPI: Bernard Hill is a 65 y.o. male presenting on 02/03/7988 for metabolic syndrome (3 month follow up of chronic medical conditions/) 65 y.o. male  Patient reports he is eating a healthy diet and has cut out sugar.  He is walking some for exercise but is very limited in what he can do due to his left knee.  Patient is on 0.5 mg of Ozempic and reports he is tolerating it well.  New complaints: None  Social history:  Relevant past medical, surgical, family and social history reviewed and updated as indicated. Interim medical history since our last visit reviewed.  Allergies and medications reviewed and updated.  DATA REVIEWED: CHART IN EPIC  ROS: Negative unless specifically indicated above in HPI.    Current Outpatient Medications:  .  ANDROGEL PUMP 20.25 MG/ACT (1.62%) GEL, Place 3 application onto the skin daily., Disp: , Rfl: 5 .  cholecalciferol (VITAMIN D) 1000 units tablet, Take 1,000 Units by mouth daily., Disp: , Rfl:  .  diclofenac Sodium (VOLTAREN) 1 % GEL,  Apply 2 g topically daily. , Disp: , Rfl:  .  Semaglutide,0.25 or 0.5MG /DOS, (OZEMPIC, 0.25 OR 0.5 MG/DOSE,) 2 MG/1.5ML SOPN, Inject 0.5 mg into the skin once a week., Disp: 1.5 mL, Rfl: 0 .  tadalafil (CIALIS) 5 MG tablet, Take 5 mg by mouth daily. , Disp: , Rfl:    No Known Allergies Past Medical History:  Diagnosis Date  . Arthritis   . BPH (benign prostatic hypertrophy)   . Chronic back pain   . History of kidney stones   . Hypogonadism in male     Past Surgical History:  Procedure Laterality Date  . CATARACT EXTRACTION W/PHACO Right 08/16/2020   Procedure: CATARACT EXTRACTION PHACO AND INTRAOCULAR LENS PLACEMENT (IOC);  Surgeon: [name], MD;  Location: [facility];  Service: Ophthalmology;  Laterality: Right;  CDE: 6.39  . CATARACT EXTRACTION W/PHACO Left 09/06/2020   Procedure: CATARACT EXTRACTION PHACO AND INTRAOCULAR LENS PLACEMENT LEFT EYE;  Surgeon: [name], MD;  Location: [facility];  Service: Ophthalmology;  Laterality: Left;  CDE: 5.96  . COLONOSCOPY    . COLONOSCOPY WITH PROPOFOL N/A 04/19/2018   Procedure: COLONOSCOPY WITH PROPOFOL;  Surgeon: Rogene Houston, MD;  Location: [facility];  Service: Endoscopy;  Laterality: N/A;  12:25  . HAND SURGERY Right 2017   palm near middle finger   . LUMBAR FUSION  90,94,96   x3  . POLYPECTOMY  04/19/2018   Procedure: POLYPECTOMY;  Surgeon: Rogene Houston, MD;  Location: [facility];  Service: Endoscopy;;  colon  . REPAIR EXTENSOR TENDON Right 09/01/2014   Procedure: EXPLORATION REPAIR FLEXOR SUPERFICIAL TENDON RIGHT MIDDLE FINGER  ;  Surgeon: Daryll Brod, MD;  Location: [facility];  Service: Orthopedics;  Laterality: Right;    Social History   Socioeconomic History  . Marital status: Married    Spouse name: [name]  . Number of children: 3  . Years of education: Not on file  . Highest education level: Not on file  Occupational History  . Occupation: truck Geophysicist/field seismologist    Comment: part time   Tobacco Use  .  Smoking status: Former Smoker    Packs/day: 1.00    Years: 10.00    Pack years: 10.00    Types: Cigarettes    Quit date: 08/29/1979    Years since quitting: 41.4  . Smokeless tobacco: Never Used  Vaping Use  . Vaping Use: Never used  Substance and Sexual Activity  . Alcohol use: No  . Drug use: No  . Sexual activity: Not Currently    Birth control/protection: None  Other Topics Concern  . Not on file  Social History Narrative  . Not on file   Social Determinants of Health   Financial Resource Strain: Not on file  Food Insecurity: Not on file  Transportation Needs: Not on file  Physical Activity: Not on file  Stress: Not on file  Social Connections: Not on file  Intimate Partner Violence: Not on file        Objective:    BP 136/88 Comment: Manual  Pulse 71   Temp 98 F (36.7 C) (Temporal)   Ht 5\' 9"  (1.753 m)   Wt (!) 308 lb 12.8 oz (140.1 kg)   SpO2 97%   BMI 45.60 kg/m   Wt Readings from Last 3 Encounters:  02/02/21 (!) 308 lb 12.8 oz (140.1 kg)  11/30/20 (!) 311 lb (141.1 kg)  11/02/20 (!) 310 lb 6 oz (140.8 kg)    Physical Exam Vitals reviewed.  Constitutional:      General: He is not in acute distress.    Appearance: Normal appearance. He is morbidly obese. He is not ill-appearing, toxic-appearing or diaphoretic.  HENT:     Head: Normocephalic and atraumatic.  Eyes:     General: No scleral icterus.       Right eye: No discharge.        Left eye: No discharge.     Conjunctiva/sclera: Conjunctivae normal.  Cardiovascular:     Rate and Rhythm: Normal rate and regular rhythm.     Heart sounds: Normal heart sounds. No murmur heard. No friction rub. No gallop.   Pulmonary:     Effort: Pulmonary effort is normal. No respiratory distress.     Breath sounds: Normal breath sounds. No stridor. No wheezing, rhonchi or rales.  Musculoskeletal:        General: Normal range of motion.     Cervical back: Normal range of motion.  Skin:    General: Skin is  warm and dry.  Neurological:     Mental Status: He is alert and oriented to person, place, and time. Mental status is at baseline.  Psychiatric:        Mood and Affect: Mood normal.        Behavior: Behavior normal.        Thought Content: Thought content normal.        Judgment: Judgment normal.     Lab  Results  Component Value Date   TSH 3.842 12/30/2007   Lab Results  Component Value Date   WBC 7.6 08/03/2020   HGB 16.5 08/03/2020   HCT 50.4 08/03/2020   MCV 83 08/03/2020   PLT 195 08/03/2020   Lab Results  Component Value Date   NA 141 08/03/2020   K 4.5 08/03/2020   CO2 25 08/03/2020   GLUCOSE 87 08/03/2020   BUN 14 08/03/2020   CREATININE 0.74 (L) 08/03/2020   BILITOT 0.6 08/03/2020   ALKPHOS 60 08/03/2020   AST 14 08/03/2020   ALT 21 08/03/2020   PROT 6.6 08/03/2020   ALBUMIN 4.4 08/03/2020   CALCIUM 9.6 08/03/2020   ANIONGAP 9 04/11/2018   Lab Results  Component Value Date   CHOL 167 08/03/2020   Lab Results  Component Value Date   HDL 40 08/03/2020   Lab Results  Component Value Date   LDLCALC 104 (H) 08/03/2020   Lab Results  Component Value Date   TRIG 130 08/03/2020   Lab Results  Component Value Date   CHOLHDL 4.2 08/03/2020   No results found for: HGBA1C

## 2021-02-02 NOTE — Patient Instructions (Signed)
Metabolic Syndrome Metabolic syndrome occurs when you have a combination of three or more factors that increase your chances of developing cardiovascular disease and diabetes. These factors include:  High fasting blood sugar (glucose).  High blood triglyceride level.  High blood pressure.  Low levels of high-density lipoprotein (HDL) blood cholesterol.  Having a waist measurement that is: ? More than 40 inches in men. ? More than 35 inches in women. Metabolic syndrome is sometimes called insulin resistance syndrome or syndrome X. What are the causes? The exact cause of this condition is not known. It may be related to a combination of the factors that were passed down from your parents (genes) and the things that you do, eat, and drink (lifestyle choices). What increases the risk? You are more likely to develop this condition if you:  Eat a diet that is high in calories and saturated fat.  Do not exercise regularly.  Are obese.  Have a family history of type 2 diabetes mellitus.  Have insulin resistance.  Have a history of gestational diabetes during a previous pregnancy.  Have conditions such as cardiovascular disease, nonalcoholic fatty liver disease, or polycystic ovary syndrome (PCOS).  Are older. The risk increases with age.  Use any tobacco products, including cigarettes, chewing tobacco, or e-cigarettes. What are the signs or symptoms? Metabolic syndrome has no specific symptoms. Having abnormal blood test results may be the only sign of metabolic syndrome. How is this diagnosed? This condition may be diagnosed based on:  Your blood pressure measurements.  Your waist measurement.  Blood tests.  Your personal and family medical history. How is this treated? Treatment may include:  Lifestyle changes to reduce your risk for heart disease, stroke, and diabetes. These include: ? Exercise. ? Weight loss. ? Eating a healthy diet. ? Stopping tobacco and nicotine  use.  Medicines that: ? Help your body maintain normal blood glucose levels. ? Lower your blood pressure and your blood triglyceride levels. You may be referred to a behavioral counselor who will help you make lifestyle changes that will lower your risk for serious disease. Follow these instructions at home: Lifestyle  Exercise regularly, as told by your health care provider.  Eat a healthy diet that includes fresh fruits and vegetables, whole grains, lean proteins, and low-fat or nonfat dairy products.  Do not use any products that contain nicotine or tobacco, such as cigarettes, e-cigarettes, and chewing tobacco. If you need help quitting, ask your health care provider.  Maintain a healthy weight. Work with your health care provider to lose weight safely, if needed.      General instructions  Take over-the-counter and prescription medicines only as told by your health care provider.  If directed, measure your waist regularly and write down the measurements. To measure your waist: ? Stand up straight. ? Breathe out. ? Wrap a measuring tape around the part of your waist that is just above your hip bones. ? Read and write down the measurement.  Keep all follow-up visits. This is important. Contact a health care provider if:  You feel very tired.  You are extremely thirsty.  You urinate a lot more than usual.  Your waist gets bigger.  You have headaches that do not go away. Get help right away if:  You suddenly develop any of the following: ? Dizziness. ? Blurry vision. ? Trouble speaking. ? Trouble swallowing. ? Weakness in an arm or leg. ? Chest pain. ? Trouble breathing.  Your heartbeat feels abnormal.  You  faint. Summary  Metabolic syndrome occurs when you have a combination of three or more factors that increase your chances of developing cardiovascular disease and diabetes.  These factors include high fasting blood sugar (glucose), high blood triglyceride  level, high blood pressure, low levels of high-density lipoprotein (HDL) blood cholesterol, and a waist measurement that is more than 40 inches in men or more than 35 inches in women.  Metabolic syndrome has no specific symptoms. Having abnormal blood test results may be the only signs of metabolic syndrome.  Treatment may include lifestyle changes and medicines to reduce your risk for heart disease, stroke, and diabetes. This information is not intended to replace advice given to you by your health care provider. Make sure you discuss any questions you have with your health care provider. Document Revised: 05/03/2020 Document Reviewed: 05/03/2020 Elsevier Patient Education  2021 Elsevier Inc.  

## 2021-02-03 DIAGNOSIS — M9903 Segmental and somatic dysfunction of lumbar region: Secondary | ICD-10-CM | POA: Diagnosis not present

## 2021-02-03 DIAGNOSIS — M1712 Unilateral primary osteoarthritis, left knee: Secondary | ICD-10-CM | POA: Diagnosis not present

## 2021-02-03 DIAGNOSIS — M25562 Pain in left knee: Secondary | ICD-10-CM | POA: Diagnosis not present

## 2021-02-03 DIAGNOSIS — M9905 Segmental and somatic dysfunction of pelvic region: Secondary | ICD-10-CM | POA: Diagnosis not present

## 2021-02-03 DIAGNOSIS — M62838 Other muscle spasm: Secondary | ICD-10-CM | POA: Diagnosis not present

## 2021-02-03 DIAGNOSIS — M2392 Unspecified internal derangement of left knee: Secondary | ICD-10-CM | POA: Diagnosis not present

## 2021-02-03 DIAGNOSIS — M9904 Segmental and somatic dysfunction of sacral region: Secondary | ICD-10-CM | POA: Diagnosis not present

## 2021-02-07 DIAGNOSIS — M9903 Segmental and somatic dysfunction of lumbar region: Secondary | ICD-10-CM | POA: Diagnosis not present

## 2021-02-07 DIAGNOSIS — M9904 Segmental and somatic dysfunction of sacral region: Secondary | ICD-10-CM | POA: Diagnosis not present

## 2021-02-07 DIAGNOSIS — M1712 Unilateral primary osteoarthritis, left knee: Secondary | ICD-10-CM | POA: Diagnosis not present

## 2021-02-07 DIAGNOSIS — M62838 Other muscle spasm: Secondary | ICD-10-CM | POA: Diagnosis not present

## 2021-02-07 DIAGNOSIS — M9905 Segmental and somatic dysfunction of pelvic region: Secondary | ICD-10-CM | POA: Diagnosis not present

## 2021-02-07 DIAGNOSIS — M25562 Pain in left knee: Secondary | ICD-10-CM | POA: Diagnosis not present

## 2021-02-07 DIAGNOSIS — M2392 Unspecified internal derangement of left knee: Secondary | ICD-10-CM | POA: Diagnosis not present

## 2021-02-14 DIAGNOSIS — M9903 Segmental and somatic dysfunction of lumbar region: Secondary | ICD-10-CM | POA: Diagnosis not present

## 2021-02-14 DIAGNOSIS — M62838 Other muscle spasm: Secondary | ICD-10-CM | POA: Diagnosis not present

## 2021-02-14 DIAGNOSIS — M25562 Pain in left knee: Secondary | ICD-10-CM | POA: Diagnosis not present

## 2021-02-14 DIAGNOSIS — M1712 Unilateral primary osteoarthritis, left knee: Secondary | ICD-10-CM | POA: Diagnosis not present

## 2021-02-14 DIAGNOSIS — M9904 Segmental and somatic dysfunction of sacral region: Secondary | ICD-10-CM | POA: Diagnosis not present

## 2021-02-14 DIAGNOSIS — M2392 Unspecified internal derangement of left knee: Secondary | ICD-10-CM | POA: Diagnosis not present

## 2021-02-14 DIAGNOSIS — M9905 Segmental and somatic dysfunction of pelvic region: Secondary | ICD-10-CM | POA: Diagnosis not present

## 2021-02-15 DIAGNOSIS — M25562 Pain in left knee: Secondary | ICD-10-CM | POA: Diagnosis not present

## 2021-02-15 DIAGNOSIS — M9903 Segmental and somatic dysfunction of lumbar region: Secondary | ICD-10-CM | POA: Diagnosis not present

## 2021-02-15 DIAGNOSIS — M2392 Unspecified internal derangement of left knee: Secondary | ICD-10-CM | POA: Diagnosis not present

## 2021-02-15 DIAGNOSIS — M1712 Unilateral primary osteoarthritis, left knee: Secondary | ICD-10-CM | POA: Diagnosis not present

## 2021-02-15 DIAGNOSIS — M9904 Segmental and somatic dysfunction of sacral region: Secondary | ICD-10-CM | POA: Diagnosis not present

## 2021-02-15 DIAGNOSIS — M9905 Segmental and somatic dysfunction of pelvic region: Secondary | ICD-10-CM | POA: Diagnosis not present

## 2021-02-15 DIAGNOSIS — M62838 Other muscle spasm: Secondary | ICD-10-CM | POA: Diagnosis not present

## 2021-02-23 DIAGNOSIS — M1712 Unilateral primary osteoarthritis, left knee: Secondary | ICD-10-CM | POA: Diagnosis not present

## 2021-02-23 DIAGNOSIS — M62838 Other muscle spasm: Secondary | ICD-10-CM | POA: Diagnosis not present

## 2021-02-23 DIAGNOSIS — M9903 Segmental and somatic dysfunction of lumbar region: Secondary | ICD-10-CM | POA: Diagnosis not present

## 2021-02-23 DIAGNOSIS — M9904 Segmental and somatic dysfunction of sacral region: Secondary | ICD-10-CM | POA: Diagnosis not present

## 2021-02-23 DIAGNOSIS — M25562 Pain in left knee: Secondary | ICD-10-CM | POA: Diagnosis not present

## 2021-02-23 DIAGNOSIS — M2392 Unspecified internal derangement of left knee: Secondary | ICD-10-CM | POA: Diagnosis not present

## 2021-02-23 DIAGNOSIS — M9905 Segmental and somatic dysfunction of pelvic region: Secondary | ICD-10-CM | POA: Diagnosis not present

## 2021-02-25 DIAGNOSIS — M961 Postlaminectomy syndrome, not elsewhere classified: Secondary | ICD-10-CM | POA: Diagnosis not present

## 2021-02-25 DIAGNOSIS — G894 Chronic pain syndrome: Secondary | ICD-10-CM | POA: Diagnosis not present

## 2021-02-25 DIAGNOSIS — M25562 Pain in left knee: Secondary | ICD-10-CM | POA: Diagnosis not present

## 2021-02-25 DIAGNOSIS — Z79891 Long term (current) use of opiate analgesic: Secondary | ICD-10-CM | POA: Diagnosis not present

## 2021-03-02 DIAGNOSIS — M1712 Unilateral primary osteoarthritis, left knee: Secondary | ICD-10-CM | POA: Diagnosis not present

## 2021-03-02 DIAGNOSIS — M9904 Segmental and somatic dysfunction of sacral region: Secondary | ICD-10-CM | POA: Diagnosis not present

## 2021-03-02 DIAGNOSIS — M62838 Other muscle spasm: Secondary | ICD-10-CM | POA: Diagnosis not present

## 2021-03-02 DIAGNOSIS — M2392 Unspecified internal derangement of left knee: Secondary | ICD-10-CM | POA: Diagnosis not present

## 2021-03-02 DIAGNOSIS — M9903 Segmental and somatic dysfunction of lumbar region: Secondary | ICD-10-CM | POA: Diagnosis not present

## 2021-03-02 DIAGNOSIS — M25562 Pain in left knee: Secondary | ICD-10-CM | POA: Diagnosis not present

## 2021-03-02 DIAGNOSIS — M9905 Segmental and somatic dysfunction of pelvic region: Secondary | ICD-10-CM | POA: Diagnosis not present

## 2021-03-07 DIAGNOSIS — M9904 Segmental and somatic dysfunction of sacral region: Secondary | ICD-10-CM | POA: Diagnosis not present

## 2021-03-07 DIAGNOSIS — M9903 Segmental and somatic dysfunction of lumbar region: Secondary | ICD-10-CM | POA: Diagnosis not present

## 2021-03-07 DIAGNOSIS — M9905 Segmental and somatic dysfunction of pelvic region: Secondary | ICD-10-CM | POA: Diagnosis not present

## 2021-03-07 DIAGNOSIS — M25562 Pain in left knee: Secondary | ICD-10-CM | POA: Diagnosis not present

## 2021-03-07 DIAGNOSIS — M1712 Unilateral primary osteoarthritis, left knee: Secondary | ICD-10-CM | POA: Diagnosis not present

## 2021-03-07 DIAGNOSIS — M62838 Other muscle spasm: Secondary | ICD-10-CM | POA: Diagnosis not present

## 2021-03-07 DIAGNOSIS — M2392 Unspecified internal derangement of left knee: Secondary | ICD-10-CM | POA: Diagnosis not present

## 2021-03-09 DIAGNOSIS — M9903 Segmental and somatic dysfunction of lumbar region: Secondary | ICD-10-CM | POA: Diagnosis not present

## 2021-03-09 DIAGNOSIS — M9905 Segmental and somatic dysfunction of pelvic region: Secondary | ICD-10-CM | POA: Diagnosis not present

## 2021-03-09 DIAGNOSIS — M9904 Segmental and somatic dysfunction of sacral region: Secondary | ICD-10-CM | POA: Diagnosis not present

## 2021-03-09 DIAGNOSIS — M2392 Unspecified internal derangement of left knee: Secondary | ICD-10-CM | POA: Diagnosis not present

## 2021-03-09 DIAGNOSIS — M25562 Pain in left knee: Secondary | ICD-10-CM | POA: Diagnosis not present

## 2021-03-09 DIAGNOSIS — M62838 Other muscle spasm: Secondary | ICD-10-CM | POA: Diagnosis not present

## 2021-03-09 DIAGNOSIS — M1712 Unilateral primary osteoarthritis, left knee: Secondary | ICD-10-CM | POA: Diagnosis not present

## 2021-03-16 DIAGNOSIS — M25562 Pain in left knee: Secondary | ICD-10-CM | POA: Diagnosis not present

## 2021-03-16 DIAGNOSIS — M1712 Unilateral primary osteoarthritis, left knee: Secondary | ICD-10-CM | POA: Diagnosis not present

## 2021-03-16 DIAGNOSIS — M9904 Segmental and somatic dysfunction of sacral region: Secondary | ICD-10-CM | POA: Diagnosis not present

## 2021-03-16 DIAGNOSIS — M9903 Segmental and somatic dysfunction of lumbar region: Secondary | ICD-10-CM | POA: Diagnosis not present

## 2021-03-16 DIAGNOSIS — M62838 Other muscle spasm: Secondary | ICD-10-CM | POA: Diagnosis not present

## 2021-03-16 DIAGNOSIS — M9905 Segmental and somatic dysfunction of pelvic region: Secondary | ICD-10-CM | POA: Diagnosis not present

## 2021-03-16 DIAGNOSIS — M2392 Unspecified internal derangement of left knee: Secondary | ICD-10-CM | POA: Diagnosis not present

## 2021-03-21 DIAGNOSIS — M62838 Other muscle spasm: Secondary | ICD-10-CM | POA: Diagnosis not present

## 2021-03-21 DIAGNOSIS — M25562 Pain in left knee: Secondary | ICD-10-CM | POA: Diagnosis not present

## 2021-03-21 DIAGNOSIS — M9905 Segmental and somatic dysfunction of pelvic region: Secondary | ICD-10-CM | POA: Diagnosis not present

## 2021-03-21 DIAGNOSIS — M9903 Segmental and somatic dysfunction of lumbar region: Secondary | ICD-10-CM | POA: Diagnosis not present

## 2021-03-21 DIAGNOSIS — M2392 Unspecified internal derangement of left knee: Secondary | ICD-10-CM | POA: Diagnosis not present

## 2021-03-21 DIAGNOSIS — M1712 Unilateral primary osteoarthritis, left knee: Secondary | ICD-10-CM | POA: Diagnosis not present

## 2021-03-21 DIAGNOSIS — M9904 Segmental and somatic dysfunction of sacral region: Secondary | ICD-10-CM | POA: Diagnosis not present

## 2021-03-23 DIAGNOSIS — M9904 Segmental and somatic dysfunction of sacral region: Secondary | ICD-10-CM | POA: Diagnosis not present

## 2021-03-23 DIAGNOSIS — M2392 Unspecified internal derangement of left knee: Secondary | ICD-10-CM | POA: Diagnosis not present

## 2021-03-23 DIAGNOSIS — M9903 Segmental and somatic dysfunction of lumbar region: Secondary | ICD-10-CM | POA: Diagnosis not present

## 2021-03-23 DIAGNOSIS — M25562 Pain in left knee: Secondary | ICD-10-CM | POA: Diagnosis not present

## 2021-03-23 DIAGNOSIS — M62838 Other muscle spasm: Secondary | ICD-10-CM | POA: Diagnosis not present

## 2021-03-23 DIAGNOSIS — M9905 Segmental and somatic dysfunction of pelvic region: Secondary | ICD-10-CM | POA: Diagnosis not present

## 2021-03-23 DIAGNOSIS — M1712 Unilateral primary osteoarthritis, left knee: Secondary | ICD-10-CM | POA: Diagnosis not present

## 2021-04-13 DIAGNOSIS — M2392 Unspecified internal derangement of left knee: Secondary | ICD-10-CM | POA: Diagnosis not present

## 2021-04-13 DIAGNOSIS — M9905 Segmental and somatic dysfunction of pelvic region: Secondary | ICD-10-CM | POA: Diagnosis not present

## 2021-04-13 DIAGNOSIS — M9904 Segmental and somatic dysfunction of sacral region: Secondary | ICD-10-CM | POA: Diagnosis not present

## 2021-04-13 DIAGNOSIS — M1712 Unilateral primary osteoarthritis, left knee: Secondary | ICD-10-CM | POA: Diagnosis not present

## 2021-04-13 DIAGNOSIS — M9903 Segmental and somatic dysfunction of lumbar region: Secondary | ICD-10-CM | POA: Diagnosis not present

## 2021-04-13 DIAGNOSIS — M25562 Pain in left knee: Secondary | ICD-10-CM | POA: Diagnosis not present

## 2021-04-13 DIAGNOSIS — M62838 Other muscle spasm: Secondary | ICD-10-CM | POA: Diagnosis not present

## 2021-04-15 DIAGNOSIS — D751 Secondary polycythemia: Secondary | ICD-10-CM | POA: Diagnosis not present

## 2021-04-18 DIAGNOSIS — M2392 Unspecified internal derangement of left knee: Secondary | ICD-10-CM | POA: Diagnosis not present

## 2021-04-18 DIAGNOSIS — M25562 Pain in left knee: Secondary | ICD-10-CM | POA: Diagnosis not present

## 2021-04-18 DIAGNOSIS — M1712 Unilateral primary osteoarthritis, left knee: Secondary | ICD-10-CM | POA: Diagnosis not present

## 2021-04-18 DIAGNOSIS — M62838 Other muscle spasm: Secondary | ICD-10-CM | POA: Diagnosis not present

## 2021-04-18 DIAGNOSIS — M9905 Segmental and somatic dysfunction of pelvic region: Secondary | ICD-10-CM | POA: Diagnosis not present

## 2021-04-18 DIAGNOSIS — M9904 Segmental and somatic dysfunction of sacral region: Secondary | ICD-10-CM | POA: Diagnosis not present

## 2021-04-18 DIAGNOSIS — M9903 Segmental and somatic dysfunction of lumbar region: Secondary | ICD-10-CM | POA: Diagnosis not present

## 2021-04-27 DIAGNOSIS — M25562 Pain in left knee: Secondary | ICD-10-CM | POA: Diagnosis not present

## 2021-04-27 DIAGNOSIS — M9905 Segmental and somatic dysfunction of pelvic region: Secondary | ICD-10-CM | POA: Diagnosis not present

## 2021-04-27 DIAGNOSIS — M2392 Unspecified internal derangement of left knee: Secondary | ICD-10-CM | POA: Diagnosis not present

## 2021-04-27 DIAGNOSIS — M62838 Other muscle spasm: Secondary | ICD-10-CM | POA: Diagnosis not present

## 2021-04-27 DIAGNOSIS — M9904 Segmental and somatic dysfunction of sacral region: Secondary | ICD-10-CM | POA: Diagnosis not present

## 2021-04-27 DIAGNOSIS — M9903 Segmental and somatic dysfunction of lumbar region: Secondary | ICD-10-CM | POA: Diagnosis not present

## 2021-04-27 DIAGNOSIS — M1712 Unilateral primary osteoarthritis, left knee: Secondary | ICD-10-CM | POA: Diagnosis not present

## 2021-05-02 DIAGNOSIS — M2392 Unspecified internal derangement of left knee: Secondary | ICD-10-CM | POA: Diagnosis not present

## 2021-05-02 DIAGNOSIS — M25562 Pain in left knee: Secondary | ICD-10-CM | POA: Diagnosis not present

## 2021-05-02 DIAGNOSIS — M62838 Other muscle spasm: Secondary | ICD-10-CM | POA: Diagnosis not present

## 2021-05-02 DIAGNOSIS — M9904 Segmental and somatic dysfunction of sacral region: Secondary | ICD-10-CM | POA: Diagnosis not present

## 2021-05-02 DIAGNOSIS — M9905 Segmental and somatic dysfunction of pelvic region: Secondary | ICD-10-CM | POA: Diagnosis not present

## 2021-05-02 DIAGNOSIS — M9903 Segmental and somatic dysfunction of lumbar region: Secondary | ICD-10-CM | POA: Diagnosis not present

## 2021-05-02 DIAGNOSIS — M1712 Unilateral primary osteoarthritis, left knee: Secondary | ICD-10-CM | POA: Diagnosis not present

## 2021-05-03 ENCOUNTER — Other Ambulatory Visit: Payer: Self-pay

## 2021-05-03 ENCOUNTER — Ambulatory Visit (INDEPENDENT_AMBULATORY_CARE_PROVIDER_SITE_OTHER): Payer: Medicare Other | Admitting: Family Medicine

## 2021-05-03 ENCOUNTER — Encounter: Payer: Self-pay | Admitting: Family Medicine

## 2021-05-03 VITALS — BP 135/81 | HR 67 | Temp 98.0°F | Ht 69.0 in | Wt 301.2 lb

## 2021-05-03 DIAGNOSIS — Z6841 Body Mass Index (BMI) 40.0 and over, adult: Secondary | ICD-10-CM

## 2021-05-03 DIAGNOSIS — E8881 Metabolic syndrome: Secondary | ICD-10-CM

## 2021-05-03 MED ORDER — OZEMPIC (2 MG/DOSE) 8 MG/3ML ~~LOC~~ SOPN: 2.0000 mg | PEN_INJECTOR | SUBCUTANEOUS | 1 refills | Status: DC

## 2021-05-03 NOTE — Progress Notes (Signed)
Assessment & Plan:  1. Metabolic syndrome Tolerating Ozempic. Dosage increased from 1 mg to 2 mg once weekly.  - Semaglutide, 2 MG/DOSE, (OZEMPIC, 2 MG/DOSE,) 8 MG/3ML SOPN; Inject 2 mg into the skin once a week.  Dispense: 9 mL; Refill: 1  2. BMI 45.0-49.9, adult ([place]) Improving. Increase Ozempic from 1 mg to 2 mg once weekly. Continue exercise and healthy eating. Starting weight on 11/02/2020: 310 lbs Today's weight: 301 lbs Weight lost since last visit 3 months ago: 7 lbs Total weight lost: 9 lbs - Semaglutide, 2 MG/DOSE, (OZEMPIC, 2 MG/DOSE,) 8 MG/3ML SOPN; Inject 2 mg into the skin once a week.  Dispense: 9 mL; Refill: 1   Return in about 3 months (around 08/03/2021) for annual physical.  Hendricks Limes, MSN, APRN, FNP-C [facility]  Subjective:    Patient ID: Bernard Hill, male    DOB: 1956/02/14, 65 y.o.   MRN: [id number]  Patient Care Team: Loman Brooklyn, FNP as PCP - General (Family Medicine) Lavera Guise, [facility] (Pharmacist)   Chief Complaint:  Chief Complaint  Patient presents with  . metabolic syndrome  . Gastroesophageal Reflux    3 month follow up of chronic medical conditions     HPI: Bernard Hill is a 65 y.o. male presenting on 5/40/0867 for metabolic syndrome and Gastroesophageal Reflux (3 month follow up of chronic medical conditions )  Patient is following up on Ozempic for metabolic syndrome. He is tolerating 1 mg once weekly well. He previously lost 2 lbs since starting Ozempic. He is walking for exercise.   New complaints: None  Social history:  Relevant past medical, surgical, family and social history reviewed and updated as indicated. Interim medical history since our last visit reviewed.  Allergies and medications reviewed and updated.  DATA REVIEWED: CHART IN EPIC  ROS: Negative unless specifically indicated above in HPI.    Current Outpatient Medications:  .  ANDROGEL PUMP 20.25 MG/ACT (1.62%) GEL, Place  3 application onto the skin daily., Disp: , Rfl: 5 .  cholecalciferol (VITAMIN D) 1000 units tablet, Take 1,000 Units by mouth daily., Disp: , Rfl:  .  diclofenac Sodium (VOLTAREN) 1 % GEL, Apply 2 g topically daily. , Disp: , Rfl:  .  Semaglutide,0.25 or 0.5MG /DOS, (OZEMPIC, 0.25 OR 0.5 MG/DOSE,) 2 MG/1.5ML SOPN, Inject 1 mg into the skin once a week., Disp: 3 mL, Rfl: 3 .  tadalafil (CIALIS) 5 MG tablet, Take 5 mg by mouth daily. , Disp: , Rfl:    Allergies  Allergen Reactions  . Lisinopril Other (See Comments)   Past Medical History:  Diagnosis Date  . Arthritis   . BPH (benign prostatic hypertrophy)   . Chronic back pain   . History of kidney stones   . Hypogonadism in male     Past Surgical History:  Procedure Laterality Date  . CATARACT EXTRACTION W/PHACO Right 08/16/2020   Procedure: CATARACT EXTRACTION PHACO AND INTRAOCULAR LENS PLACEMENT (IOC);  Surgeon: [name], MD;  Location: [facility];  Service: Ophthalmology;  Laterality: Right;  CDE: 6.39  . CATARACT EXTRACTION W/PHACO Left 09/06/2020   Procedure: CATARACT EXTRACTION PHACO AND INTRAOCULAR LENS PLACEMENT LEFT EYE;  Surgeon: [name], MD;  Location: [facility];  Service: Ophthalmology;  Laterality: Left;  CDE: 5.96  . COLONOSCOPY    . COLONOSCOPY WITH PROPOFOL N/A 04/19/2018   Procedure: COLONOSCOPY WITH PROPOFOL;  Surgeon: Rogene Houston, MD;  [facility];  Service: Endoscopy;  Laterality:  N/A;  12:25  . HAND SURGERY Right 2017   palm near middle finger   . LUMBAR FUSION  90,94,96   x3  . POLYPECTOMY  04/19/2018   Procedure: POLYPECTOMY;  Surgeon: Rogene Houston, MD;  [facility];  Service: Endoscopy;;  colon  . REPAIR EXTENSOR TENDON Right 09/01/2014   Procedure: EXPLORATION REPAIR FLEXOR SUPERFICIAL TENDON RIGHT MIDDLE FINGER  ;  Surgeon: Daryll Brod, MD;  Location: [place];  Service: Orthopedics;  Laterality: Right;    Social History   Socioeconomic History  . Marital  status: Married    Spouse name: [name]  . Number of children: 3  . Years of education: Not on file  . Highest education level: Not on file  Occupational History  . Occupation: truck Geophysicist/field seismologist    Comment: part time   Tobacco Use  . Smoking status: Former Smoker    Packs/day: 1.00    Years: 10.00    Pack years: 10.00    Types: Cigarettes    Quit date: 08/29/1979    Years since quitting: 41.7  . Smokeless tobacco: Never Used  Vaping Use  . Vaping Use: Never used  Substance and Sexual Activity  . Alcohol use: No  . Drug use: No  . Sexual activity: Not Currently    Birth control/protection: None  Other Topics Concern  . Not on file  Social History Narrative  . Not on file   Social Determinants of Health   Financial Resource Strain: Not on file  Food Insecurity: Not on file  Transportation Needs: Not on file  Physical Activity: Not on file  Stress: Not on file  Social Connections: Not on file  Intimate Partner Violence: Not on file        Objective:    BP 135/81   Pulse 67   Temp 98 F (36.7 C) (Temporal)   Ht 5\' 9"  (1.753 m)   Wt (!) 301 lb 3.2 oz (136.6 kg)   SpO2 98%   BMI 44.48 kg/m   Wt Readings from Last 3 Encounters:  05/03/21 (!) 301 lb 3.2 oz (136.6 kg)  02/02/21 (!) 308 lb 12.8 oz (140.1 kg)  11/30/20 (!) 311 lb (141.1 kg)    Physical Exam Vitals reviewed.  Constitutional:      General: He is not in acute distress.    Appearance: Normal appearance. He is morbidly obese. He is not ill-appearing, toxic-appearing or diaphoretic.  HENT:     Head: Normocephalic and atraumatic.  Eyes:     General: No scleral icterus.       Right eye: No discharge.        Left eye: No discharge.     Conjunctiva/sclera: Conjunctivae normal.  Cardiovascular:     Rate and Rhythm: Normal rate and regular rhythm.     Heart sounds: Normal heart sounds. No murmur heard. No friction rub. No gallop.   Pulmonary:     Effort: Pulmonary effort is normal. No respiratory distress.      Breath sounds: Normal breath sounds. No stridor. No wheezing, rhonchi or rales.  Musculoskeletal:        General: Normal range of motion.     Cervical back: Normal range of motion.  Skin:    General: Skin is warm and dry.  Neurological:     Mental Status: He is alert and oriented to person, place, and time. Mental status is at baseline.  Psychiatric:        Mood and  Affect: Mood normal.        Behavior: Behavior normal.        Thought Content: Thought content normal.        Judgment: Judgment normal.     Lab Results  Component Value Date   TSH 3.842 12/30/2007   Lab Results  Component Value Date   WBC 7.6 08/03/2020   HGB 16.5 08/03/2020   HCT 50.4 08/03/2020   MCV 83 08/03/2020   PLT 195 08/03/2020   Lab Results  Component Value Date   NA 141 08/03/2020   K 4.5 08/03/2020   CO2 25 08/03/2020   GLUCOSE 87 08/03/2020   BUN 14 08/03/2020   CREATININE 0.74 (L) 08/03/2020   BILITOT 0.6 08/03/2020   ALKPHOS 60 08/03/2020   AST 14 08/03/2020   ALT 21 08/03/2020   PROT 6.6 08/03/2020   ALBUMIN 4.4 08/03/2020   CALCIUM 9.6 08/03/2020   ANIONGAP 9 04/11/2018   Lab Results  Component Value Date   CHOL 167 08/03/2020   Lab Results  Component Value Date   HDL 40 08/03/2020   Lab Results  Component Value Date   LDLCALC 104 (H) 08/03/2020   Lab Results  Component Value Date   TRIG 130 08/03/2020   Lab Results  Component Value Date   CHOLHDL 4.2 08/03/2020   No results found for: HGBA1C

## 2021-05-04 ENCOUNTER — Telehealth: Payer: Self-pay | Admitting: Pharmacist

## 2021-05-04 DIAGNOSIS — Z6841 Body Mass Index (BMI) 40.0 and over, adult: Secondary | ICD-10-CM

## 2021-05-04 DIAGNOSIS — E8881 Metabolic syndrome: Secondary | ICD-10-CM

## 2021-05-04 NOTE — Telephone Encounter (Signed)
PA submitted via cover my meds DX: impaired FBG, metabolic syndrome

## 2021-05-08 ENCOUNTER — Encounter: Payer: Self-pay | Admitting: Family Medicine

## 2021-05-09 NOTE — Telephone Encounter (Signed)
Request Reference Number: [id number]. OZEMPIC INJ 8MG /3ML is denied for not meeting the prior authorization requirement(s). Details of this decision are in the notice attached below or have been faxed to you. Appeals are not supported through [place]. Please refer to the fax case notice for appeals information and instructions.

## 2021-05-10 MED ORDER — OZEMPIC (1 MG/DOSE) 4 MG/3ML ~~LOC~~ SOPN: 1.0000 mg | PEN_INJECTOR | SUBCUTANEOUS | 2 refills | Status: DC

## 2021-05-10 NOTE — Telephone Encounter (Signed)
Patient aware.

## 2021-05-10 NOTE — Telephone Encounter (Signed)
Please make patient aware we are going to stick with the 1 mg dose due to insurance not covering the 2 mg.

## 2021-05-25 ENCOUNTER — Ambulatory Visit (INDEPENDENT_AMBULATORY_CARE_PROVIDER_SITE_OTHER): Payer: Medicare Other

## 2021-05-25 DIAGNOSIS — Z0000 Encounter for general adult medical examination without abnormal findings: Secondary | ICD-10-CM | POA: Diagnosis not present

## 2021-05-25 NOTE — Progress Notes (Signed)
MEDICARE ANNUAL WELLNESS VISIT  05/25/2021  Telephone Visit Disclaimer This Medicare AWV was conducted by telephone due to national recommendations for restrictions regarding the COVID-19 Pandemic (e.g. social distancing).  I verified, using two identifiers, that I am speaking with Bernard Hill or their authorized healthcare agent. I discussed the limitations, risks, security, and privacy concerns of performing an evaluation and management service by telephone and the potential availability of an in-person appointment in the future. The patient expressed understanding and agreed to proceed.  Location of Patient: Home Location of Provider (nurse):  [facility]  Subjective:    Bernard Hill is a 65 y.o. male patient of Bernard Brooklyn, FNP who had a Medicare Annual Wellness Visit today via telephone. Bernard Hill is a very pleasant man who lives in nearby [place]. He lives with his wife and also their grown daughter and her child live with them. They have another daughter that is also grown that has married and moved out. 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 He enjoys fishing and playing with his grandkids. He doesn't have a regular exercise routine but walks frequently and swims in his pool.   Patient Care Team: Bernard Brooklyn, FNP as PCP - General (Family Medicine) Lavera Guise, [facility] (Pharmacist) Nicholaus Bloom, MD (Anesthesiology)  Advanced Directives 05/25/2021 08/16/2020 04/19/2018 04/11/2018 05/22/2015 09/01/2014 08/28/2014  Does Patient Have a Medical Advance Directive? No No Yes No No No No  Type of Advance Directive - - Living will - - - -  Would patient like information on creating a medical advance directive? No - Patient declined No - Patient declined - No - Patient declined - No - patient declined information No - patient declined information    Hospital Utilization Over the Past 12 Months: # of hospitalizations or ER visits: 0 # of surgeries:  0  Review of Systems    Patient reports that his overall health is unchanged compared to last year.  History obtained from chart review  Patient Reported Readings (BP, Pulse, CBG, Weight, etc) none  Pain Assessment Pain : No/denies pain     Current Medications & Allergies (verified) Allergies as of 05/25/2021      Reactions   Lisinopril Other (See Comments)      Medication List       Accurate as of May 25, 2021  4:15 PM. If you have any questions, ask your nurse or doctor.        AndroGel Pump 20.25 MG/ACT (1.62%) Gel Generic drug: Testosterone Place 3 application onto the skin daily.   cholecalciferol 1000 units tablet Commonly known as: VITAMIN D Take 1,000 Units by mouth daily.   diclofenac Sodium 1 % Gel Commonly known as: VOLTAREN Apply 2 g topically daily.   Ozempic (1 MG/DOSE) 4 MG/3ML Sopn Generic drug: Semaglutide (1 MG/DOSE) Inject 1 mg into the skin once a week.   tadalafil 5 MG tablet Commonly known as: CIALIS Take 5 mg by mouth daily.       History (reviewed): Past Medical History:  Diagnosis Date  . Arthritis   . BPH (benign prostatic hypertrophy)   . Chronic back pain   . History of kidney stones   . Hypogonadism in male    Past Surgical History:  Procedure Laterality Date  . CATARACT EXTRACTION W/PHACO Right 08/16/2020   Procedure: CATARACT EXTRACTION PHACO AND INTRAOCULAR LENS PLACEMENT (IOC);  Surgeon: [name], MD;  [facility]: [facility];  Service:  Ophthalmology;  Laterality: Right;  CDE: 6.39  . CATARACT EXTRACTION W/PHACO Left 09/06/2020   Procedure: CATARACT EXTRACTION PHACO AND INTRAOCULAR LENS PLACEMENT LEFT EYE;  Surgeon: [name], MD;  Location: [facility];  Service: Ophthalmology;  Laterality: Left;  CDE: 5.96  . COLONOSCOPY    . COLONOSCOPY WITH PROPOFOL N/A 04/19/2018   Procedure: COLONOSCOPY WITH PROPOFOL;  Surgeon: Rogene Houston, MD;  [facility];  Service: Endoscopy;  Laterality: N/A;  12:25  . HAND  SURGERY Right 2017   palm near middle finger   . LUMBAR FUSION  90,94,96   x3  . POLYPECTOMY  04/19/2018   Procedure: POLYPECTOMY;  Surgeon: Rogene Houston, MD;  [facility];  Service: Endoscopy;;  colon  . REPAIR EXTENSOR TENDON Right 09/01/2014   Procedure: EXPLORATION REPAIR FLEXOR SUPERFICIAL TENDON RIGHT MIDDLE FINGER  ;  Surgeon: Daryll Brod, MD;  Location: [place];  Service: Orthopedics;  Laterality: Right;   Family History  Problem Relation Age of Onset  . Cancer Mother        breast   . Heart disease Mother   . Hypertension Sister   . Cancer Brother        bladder and colon   . Asthma Daughter   . Hypertension Daughter    Social History   Socioeconomic History  . Marital status: Married    Spouse name: [name]  . Number of children: 2  . Years of education: Not on file  . Highest education level: 12th grade  Occupational History  . Occupation: truck Geophysicist/field seismologist    Comment: part time   Tobacco Use  . Smoking status: Former Smoker    Packs/day: 1.00    Years: 10.00    Pack years: 10.00    Types: Cigarettes    Quit date: 08/29/1979    Years since quitting: 41.7  . Smokeless tobacco: Never Used  Vaping Use  . Vaping Use: Never used  Substance and Sexual Activity  . Alcohol use: No  . Drug use: No  . Sexual activity: Not Currently    Birth control/protection: None  Other Topics Concern  . Not on file  Social History Narrative  . Not on file   Social Determinants of Health   Financial Resource Strain: Not on file  Food Insecurity: Not on file  Transportation Needs: Not on file  Physical Activity: Not on file  Stress: Not on file  Social Connections: Not on file    Activities of Daily Living In your present state of health, do you have any difficulty performing the following activities: 05/25/2021  Hearing? N  Vision? N  Difficulty concentrating or making decisions? N  Walking or climbing stairs? N  Dressing or bathing? N  Doing  errands, shopping? N  Preparing Food and eating ? N  Using the Toilet? N  In the past six months, have you accidently leaked urine? N  Do you have problems with loss of bowel control? N  Managing your Medications? N  Managing your Finances? N  Housekeeping or managing your Housekeeping? N  Some recent data might be hidden    Patient Education/ Literacy How often do you need to have someone help you when you read instructions, pamphlets, or other written materials from your doctor or pharmacy?: 1 - Never What is the last grade level you completed in school?: 12th grade  Exercise Current Exercise Habits: The patient does not participate in regular exercise at present, Exercise limited  by: None identified  Diet Patient reports consuming 3 meals a day and 2 snack(s) a day Patient reports that his primary diet is: Regular Patient reports that she does have regular access to food.   Depression Screen PHQ 2/9 Scores 05/25/2021 05/03/2021 02/02/2021 11/02/2020 08/03/2020  PHQ - 2 Score 0 0 0 0 0     Fall Risk Fall Risk  05/25/2021 05/03/2021 02/02/2021 11/02/2020 08/03/2020  Falls in the past year? 0 0 0 0 0  Follow up - - - Falls evaluation completed -     Objective:  Bernard Hill seemed alert and oriented and he participated appropriately during our telephone visit.  Blood Pressure Weight BMI  BP Readings from Last 3 Encounters:  05/03/21 135/81  02/02/21 136/88  11/02/20 139/87   Wt Readings from Last 3 Encounters:  05/03/21 (!) 301 lb 3.2 oz (136.6 kg)  02/02/21 (!) 308 lb 12.8 oz (140.1 kg)  11/30/20 (!) 311 lb (141.1 kg)   BMI Readings from Last 1 Encounters:  05/03/21 44.48 kg/m    *Unable to obtain current vital signs, weight, and BMI due to telephone visit type  Hearing/Vision  . Jahmari did not seem to have difficulty with hearing/understanding during the telephone conversation . Reports that he has had a formal eye exam by an eye care professional within the past  year . Reports that he has not had a formal hearing evaluation within the past year *Unable to fully assess hearing and vision during telephone visit type  Cognitive Function: 6CIT Screen 05/25/2021  What Year? 0 points  What month? 0 points  What time? 0 points  Count back from 20 0 points  Months in reverse 0 points  Repeat phrase 0 points  Total Score 0   (Normal:0-7, Significant for Dysfunction: >8)  Normal Cognitive Function Screening: Yes   Immunization & Health Maintenance Record Immunization History  Administered Date(s) Administered  . Fluad Quad(high Dose 65+) 11/02/2020  . Influenza, Seasonal, Injecte, Preservative Fre 10/09/2019  . Pneumococcal Conjugate-13 10/09/2019, 11/02/2020  . Tdap 10/09/2019  . Tetanus 12/26/2015    Health Maintenance  Topic Date Due  . Zoster Vaccines- Shingrix (1 of 2) Never done  . COVID-19 Vaccine (1) 06/10/2021 (Originally 09/13/1968)  . Hepatitis C Screening  08/03/2021 (Originally 09/13/1974)  . INFLUENZA VACCINE  07/25/2021  . PNA vac Low Risk Adult (2 of 2 - PPSV23) 11/02/2021  . COLONOSCOPY (Pts 45-65yrs Insurance coverage will need to be confirmed)  04/19/2025  . TETANUS/TDAP  10/08/2029  . HPV VACCINES  Aged Out       Assessment  This is a routine wellness examination for Bernard Hill.  Health Maintenance: Due or Overdue Health Maintenance Due  Topic Date Due  . Zoster Vaccines- Shingrix (1 of 2) Never done    Bernard Hill does not need a referral for Community Assistance: Care Management:   no Social Work:    no Prescription Assistance:  no Nutrition/Diabetes Education:  no   Plan:  Personalized Goals Goals Addressed            This Visit's Progress   . DIET - EAT MORE FRUITS AND VEGETABLES      . Exercise 150 min/wk Moderate Activity        Personalized Health Maintenance & Screening Recommendations  shingrix vaccine  Lung Cancer Screening Recommended: no (Low Dose CT Chest recommended if Age  72-80 years, 30 pack-year currently smoking OR have quit w/in past 15 years) Hepatitis C  Screening recommended: yes HIV Screening recommended: no  Advanced Directives: Written information was not prepared per patient's request.  Referrals & Orders No orders of the defined types were placed in this encounter.   Follow-up Plan . Follow-up with Bernard Brooklyn, FNP as planned . Needs shingrix vaccine and Hepatitis C screening   I have personally reviewed and noted the following in the patient's chart:   . Medical and social history . Use of alcohol, tobacco or illicit drugs  . Current medications and supplements . Functional ability and status . Nutritional status . Physical activity . Advanced directives . List of other physicians . Hospitalizations, surgeries, and ER visits in previous 12 months . Vitals . Screenings to include cognitive, depression, and falls . Referrals and appointments  In addition, I have reviewed and discussed with Bernard Hill certain preventive protocols, quality metrics, and best practice recommendations. A written personalized care plan for preventive services as well as general preventive health recommendations is available and can be mailed to the patient at his request.      Rolena Infante LPN 03/02/8827

## 2021-05-25 NOTE — Patient Instructions (Signed)
  Mr. Reth , Thank you for taking time to come for your Medicare Wellness Visit. I appreciate your ongoing commitment to your health goals. Please review the following plan we discussed and let me know if I can assist you in the future.   These are the goals we discussed: Goals    . DIET - EAT MORE FRUITS AND VEGETABLES    . Exercise 150 min/wk Moderate Activity       This is a list of the screening recommended for you and due dates:  Health Maintenance  Topic Date Due  . Zoster (Shingles) Vaccine (1 of 2) Never done  . COVID-19 Vaccine (1) 06/10/2021*  . Hepatitis C Screening: USPSTF Recommendation to screen - Ages 18-79 yo.  08/03/2021*  . Flu Shot  07/25/2021  . Pneumonia vaccines (2 of 2 - PPSV23) 11/02/2021  . Colon Cancer Screening  04/19/2025  . Tetanus Vaccine  10/08/2029  . HPV Vaccine  Aged Out  *Topic was postponed. The date shown is not the original due date.

## 2021-06-23 ENCOUNTER — Ambulatory Visit (INDEPENDENT_AMBULATORY_CARE_PROVIDER_SITE_OTHER): Payer: Medicare Other | Admitting: Endocrinology

## 2021-06-23 ENCOUNTER — Encounter: Payer: Self-pay | Admitting: Endocrinology

## 2021-06-23 ENCOUNTER — Other Ambulatory Visit: Payer: Self-pay

## 2021-06-23 VITALS — BP 138/80 | HR 84 | Ht 70.0 in | Wt 297.0 lb

## 2021-06-23 DIAGNOSIS — E230 Hypopituitarism: Secondary | ICD-10-CM | POA: Diagnosis not present

## 2021-06-23 NOTE — Patient Instructions (Addendum)
Please go off the testosterone. In approx 2 months, please check the testosterone.  It does not have to be fasting, but the morning is more accurate. Based on the results, I may be able to prescribe for you a pill instead of the gel.  Weight loss also helps the testosterone.

## 2021-06-23 NOTE — Progress Notes (Signed)
Subjective:    Patient ID: Bernard Hill, male    DOB: October 16, 1956, 65 y.o.   MRN: [id number]  HPI Pt is referred by Dr Lovena Neighbours, for low testosterone level.  Pt reports he had puberty at the normal age.  He has 3 biological children.  He says he has never taken illicit androgens.  He was found to have low testosterone in approx 2008 (was checked due to fatigue).  He does not take antiandrogens or opioids.  He denies any h/o infertility, XRT, or genital infection.  He has never had surgery, or a serious injury to the head or genital area. He has no h/o sleep apnea or DVT.   He does not consume alcohol excessively.  He reports ongoing fatigue.   Past Medical History:  Diagnosis Date   Arthritis    BPH (benign prostatic hypertrophy)    Chronic back pain    History of kidney stones    Hypogonadism in male     Past Surgical History:  Procedure Laterality Date   CATARACT EXTRACTION W/PHACO Right 08/16/2020   Procedure: CATARACT EXTRACTION PHACO AND INTRAOCULAR LENS PLACEMENT ([place]);  Surgeon: [name], MD;  Location: [facility];  Service: Ophthalmology;  Laterality: Right;  CDE: 6.39   CATARACT EXTRACTION W/PHACO Left 09/06/2020   Procedure: CATARACT EXTRACTION PHACO AND INTRAOCULAR LENS PLACEMENT LEFT EYE;  Surgeon: [name], MD;  Location: [facility];  Service: Ophthalmology;  Laterality: Left;  CDE: 5.96   COLONOSCOPY     COLONOSCOPY WITH PROPOFOL N/A 04/19/2018   Procedure: COLONOSCOPY WITH PROPOFOL;  Surgeon: Rogene Houston, MD;  [facility];  Service: Endoscopy;  Laterality: N/A;  12:25   HAND SURGERY Right 2017   palm near middle finger    LUMBAR FUSION  90,94,96   x3   POLYPECTOMY  04/19/2018   Procedure: POLYPECTOMY;  Surgeon: Rogene Houston, MD;  [facility];  Service: Endoscopy;;  colon   REPAIR EXTENSOR TENDON Right 09/01/2014   Procedure: EXPLORATION REPAIR FLEXOR SUPERFICIAL TENDON RIGHT MIDDLE FINGER  ;  Surgeon: Daryll Brod, MD;  Location: [facility];  Service: Orthopedics;  Laterality: Right;    Social History   Socioeconomic History   Marital status: Married    Spouse name: [name]   Number of children: 2   Years of education: Not on file   Highest education level: 12th grade  Occupational History   Occupation: truck driver    Comment: part time   Tobacco Use   Smoking status: Former    Packs/day: 1.00    Years: 10.00    Pack years: 10.00    Types: Cigarettes    Quit date: 08/29/1979    Years since quitting: 41.8   Smokeless tobacco: Never  Vaping Use   Vaping Use: Never used  Substance and Sexual Activity   Alcohol use: No   Drug use: No   Sexual activity: Not Currently    Birth control/protection: None  Other Topics Concern   Not on file  Social History Narrative   Not on file   Social Determinants of Health   Financial Resource Strain: Not on file  Food Insecurity: Not on file  Transportation Needs: Not on file  Physical Activity: Not on file  Stress: Not on file  Social Connections: Not on file  Intimate Partner Violence: Not on file    Current Outpatient Medications on File Prior to Visit  Medication Sig Dispense Refill   cholecalciferol (VITAMIN  D) 1000 units tablet Take 1,000 Units by mouth daily.     diclofenac Sodium (VOLTAREN) 1 % GEL Apply 2 g topically daily.      Semaglutide, 1 MG/DOSE, (OZEMPIC, 1 MG/DOSE,) 4 MG/3ML SOPN Inject 1 mg into the skin once a week. 9 mL 2   tadalafil (CIALIS) 5 MG tablet Take 5 mg by mouth daily.      No current facility-administered medications on file prior to visit.    Allergies  Allergen Reactions   Lisinopril Other (See Comments)    Family History  Problem Relation Age of Onset   Cancer Mother        breast    Heart disease Mother    Hypertension Sister    Cancer Brother        bladder and colon    Asthma Daughter    Hypertension Daughter    Other Neg Hx     BP 138/80   Pulse 84   Ht 5\' 10"  (1.778 m)   Wt 297 lb (134.7 kg)    SpO2 94%   BMI 42.62 kg/m     Review of Systems denies depression, numbness, headache, and sob.  Cialis helps ED sxs.  He has recently lost 13 lbs--intentional.      Objective:   Physical Exam VS: see vs page GEN: no distress HEAD: head: no deformity eyes: no periorbital swelling, no proptosis external nose and ears are normal NECK: supple, thyroid is not enlarged CHEST WALL: no deformity LUNGS: clear to auscultation BREASTS:  bilat pseudogynecomastia CV: reg rate and rhythm, no murmur GENITALIA:  Normal male.   MUSCULOSKELETAL: muscle bulk and strength are grossly normal.  no joint swelling is seen  gait is normal and steady.  Brace on left knee EXTEMITIES: 1+ bilat leg edema NEURO: sensation is intact to touch on all 4's SKIN:  Normal texture and temperature.  No rash or suspicious lesion is visible.  Normal male hair distribution. NODES:  None palpable at the neck PSYCH: alert, well-oriented.  Does not appear anxious nor depressed.    outside test results are reviewed:  PSA (2021): 1.1 MRI (2010): normal pituitary Testosterone=236.    I have reviewed outside records, and summarized: Pt was noted to have low testosterone, and referred here.  injected testosterone did not help sxs.  He donates blood, to rx polycythemia.       Assessment & Plan:  Low testosterone, on rx Polycythemia: side effect of testosterone rx.    Patient Instructions  Please go off the testosterone. In approx 2 months, please check the testosterone.  It does not have to be fasting, but the morning is more accurate. Based on the results, I may be able to prescribe for you a pill instead of the gel.  Weight loss also helps the testosterone.

## 2021-07-01 DIAGNOSIS — Z79891 Long term (current) use of opiate analgesic: Secondary | ICD-10-CM | POA: Diagnosis not present

## 2021-07-01 DIAGNOSIS — M961 Postlaminectomy syndrome, not elsewhere classified: Secondary | ICD-10-CM | POA: Diagnosis not present

## 2021-07-01 DIAGNOSIS — M25562 Pain in left knee: Secondary | ICD-10-CM | POA: Diagnosis not present

## 2021-07-01 DIAGNOSIS — G894 Chronic pain syndrome: Secondary | ICD-10-CM | POA: Diagnosis not present

## 2021-07-12 DIAGNOSIS — H524 Presbyopia: Secondary | ICD-10-CM | POA: Diagnosis not present

## 2021-07-12 DIAGNOSIS — Z961 Presence of intraocular lens: Secondary | ICD-10-CM | POA: Diagnosis not present

## 2021-07-22 DIAGNOSIS — M1711 Unilateral primary osteoarthritis, right knee: Secondary | ICD-10-CM | POA: Diagnosis not present

## 2021-08-03 ENCOUNTER — Ambulatory Visit (INDEPENDENT_AMBULATORY_CARE_PROVIDER_SITE_OTHER): Payer: Medicare Other | Admitting: Family Medicine

## 2021-08-03 ENCOUNTER — Encounter: Payer: Self-pay | Admitting: Family Medicine

## 2021-08-03 ENCOUNTER — Other Ambulatory Visit: Payer: Self-pay

## 2021-08-03 VITALS — BP 128/70 | HR 73 | Temp 97.9°F | Ht 70.0 in | Wt 299.8 lb

## 2021-08-03 DIAGNOSIS — Z0000 Encounter for general adult medical examination without abnormal findings: Secondary | ICD-10-CM

## 2021-08-03 DIAGNOSIS — E8881 Metabolic syndrome: Secondary | ICD-10-CM

## 2021-08-03 DIAGNOSIS — H6122 Impacted cerumen, left ear: Secondary | ICD-10-CM | POA: Diagnosis not present

## 2021-08-03 DIAGNOSIS — G894 Chronic pain syndrome: Secondary | ICD-10-CM | POA: Diagnosis not present

## 2021-08-03 DIAGNOSIS — E349 Endocrine disorder, unspecified: Secondary | ICD-10-CM

## 2021-08-03 DIAGNOSIS — Z0001 Encounter for general adult medical examination with abnormal findings: Secondary | ICD-10-CM

## 2021-08-03 DIAGNOSIS — N400 Enlarged prostate without lower urinary tract symptoms: Secondary | ICD-10-CM

## 2021-08-03 DIAGNOSIS — R972 Elevated prostate specific antigen [PSA]: Secondary | ICD-10-CM

## 2021-08-03 DIAGNOSIS — Z6841 Body Mass Index (BMI) 40.0 and over, adult: Secondary | ICD-10-CM

## 2021-08-03 NOTE — Progress Notes (Signed)
Assessment & Plan:  1. Well adult exam - preventative health information provided - to return for flu vaccination when available - CBC with Differential/Platelet - CMP14+EGFR - Lipid panel  2. Metabolic syndrome - ozempic 1 mg, insurance will not cover increase to 39m - referral to community care coordination for assistance with trulicity - CBC with Differential/Platelet - CMP14+EGFR - Lipid panel  3. Morbid obesity with BMI of 40.0-44.9, [place] ([place] - ozempic 154m insurance will not cover increase to 19m19m referral to community care coordination for assistance with trulicity - has lost 11 lbs since last visit - CBC with Differential/Platelet - CMP14+EGFR - Lipid panel  4. Chronic pain syndrome - followed by Dr. PhiHardin Negus the pain clinic - tylenol prn  5. BPH with elevated PSA - followed by Dr. WinGilford Rileth urology - PSA checks every 6 months by urology  6. Hypotestosteronism - testosterone on hold by urology for elevated PSA  7. Left ear impacted cerumen - debrox for cerumen softening and removal - printed education provided   Follow-up: Return ASAP with Bernard Hill get Trulicity; then me for weight loss.   AshLucile CraterP Student  Subjective:  Patient ID: Bernard Hill    DOB: 9/2Nov 10, 1957ge: [age]o. MRN: [id number] Care Team: JoyLoman BrooklynNP as PCP - General (Family Medicine) PruLavera GuisePHHouston [facility] Hospitalharmacist) PhiNicholaus BloomD (Anesthesiology) JohCelestia KhatD (Optometry)   CC:  Chief Complaint  Patient presents with   Annual Exam    HPI MayCHARLIS HARNEResents for annual exam.  Occupation: truck driver, Marital status: married, Substance use: no Diet: reduced sugar, Exercise: none, limited by left knee osteoarthritis Last eye exam: 06/2021, [facility] Last dental exam: Dr. SmiTamala JuliandeLedell Nossears partials  Last colonoscopy: due 04/19/2025 Lung Cancer Screening with low-dose Chest CT: 10 year pack history, not eligible AAA  Screening: declined Hepatitis C Screening: declined PSA: last month by Dr. WinGilford Rileth urology Immunizations: Flu Vaccine:  will receive when avilable Tdap Vaccine: up to date  Shingrix Vaccine: declined  COVID-19 Vaccine: declined Pneumonia Vaccine: up to date, next due 11/02/21  DEPRESSION SCREENING PHQ 2/9 Scores 08/03/2021 05/25/2021 05/03/2021 02/02/2021 11/02/2020 08/03/2020  PHQ - 2 Score 0 0 0 0 0 0  PHQ- 9 Score 0 - - - - -    New complaints: none  Review of Systems  Constitutional:  Positive for weight loss. Negative for diaphoresis and malaise/fatigue.  HENT: Negative.    Eyes: Negative.   Respiratory: Negative.  Negative for shortness of breath and wheezing.   Cardiovascular:  Negative for chest pain, palpitations, orthopnea and leg swelling.  Gastrointestinal: Negative.  Negative for abdominal pain, nausea and vomiting.  Genitourinary:  Positive for frequency.  Musculoskeletal:  Positive for joint pain (left knee).  Skin: Negative.   Neurological: Negative.  Negative for dizziness and headaches.  Psychiatric/Behavioral: Negative.      Current Outpatient Medications:    cholecalciferol (VITAMIN D) 1000 units tablet, Take 1,000 Units by mouth daily., Disp: , Rfl:    Semaglutide, 1 MG/DOSE, (OZEMPIC, 1 MG/DOSE,) 4 MG/3ML SOPN, Inject 1 mg into the skin once a week., Disp: 9 mL, Rfl: 2   tadalafil (CIALIS) 5 MG tablet, Take 5 mg by mouth daily. , Disp: , Rfl:   Allergies  Allergen Reactions   Lisinopril Other (See Comments)    Past Medical History:  Diagnosis Date   Arthritis    BPH (benign prostatic hypertrophy)  Chronic back pain    History of kidney stones    Hypogonadism in male     Past Surgical History:  Procedure Laterality Date   CATARACT EXTRACTION W/PHACO Right 08/16/2020   Procedure: CATARACT EXTRACTION PHACO AND INTRAOCULAR LENS PLACEMENT ([place]);  Surgeon: [name], MD;  [facility];  Service: Ophthalmology;  Laterality: Right;  CDE: 6.39    CATARACT EXTRACTION W/PHACO Left 09/06/2020   Procedure: CATARACT EXTRACTION PHACO AND INTRAOCULAR LENS PLACEMENT LEFT EYE;  Surgeon: [name], MD;  [facility];  Service: Ophthalmology;  Laterality: Left;  CDE: 5.96   COLONOSCOPY     COLONOSCOPY WITH PROPOFOL N/A 04/19/2018   Procedure: COLONOSCOPY WITH PROPOFOL;  Surgeon: Rogene Houston, MD;  [facility];  Service: Endoscopy;  Laterality: N/A;  12:25   HAND SURGERY Right 2017   palm near middle finger    LUMBAR FUSION  90,94,96   x3   POLYPECTOMY  04/19/2018   Procedure: POLYPECTOMY;  Surgeon: Rogene Houston, MD;  [facility];  Service: Endoscopy;;  colon   REPAIR EXTENSOR TENDON Right 09/01/2014   Procedure: EXPLORATION REPAIR FLEXOR SUPERFICIAL TENDON RIGHT MIDDLE FINGER  ;  Surgeon: Daryll Brod, MD;  Location: [facility];  Service: Orthopedics;  Laterality: Right;    Family History  Problem Relation Age of Onset   Cancer Mother        breast    Heart disease Mother    Hypertension Sister    Cancer Brother        bladder and colon    Asthma Daughter    Hypertension Daughter    Other Neg Hx     Social History   Socioeconomic History   Marital status: Married    Spouse name: [name]   Number of children: 2   Years of education: Not on file   Highest education level: 12th grade  Occupational History   Occupation: truck Geophysicist/field seismologist    Comment: part time   Tobacco Use   Smoking status: Former    Packs/day: 1.00    Years: 10.00    Pack years: 10.00    Types: Cigarettes    Quit date: 08/29/1979    Years since quitting: 41.9   Smokeless tobacco: Never  Vaping Use   Vaping Use: Never used  Substance and Sexual Activity   Alcohol use: No   Drug use: No   Sexual activity: Not Currently    Birth control/protection: None  Other Topics Concern   Not on file  Social History Narrative   Not on file   Social Determinants of Health   Financial Resource Strain: Not on file   Food Insecurity: Not on file  Transportation Needs: Not on file  Physical Activity: Not on file  Stress: Not on file  Social Connections: Not on file  Intimate Partner Violence: Not on file      Objective:    BP 128/70   Pulse 73   Temp 97.9 F (36.6 C) (Temporal)   Ht 5' 10" (1.778 m)   Wt 136 kg   SpO2 95%   BMI 43.02 kg/m   Wt Readings from Last 3 Encounters:  08/03/21 136 kg  06/23/21 134.7 kg  05/03/21 (!) 136.6 kg    Physical Exam Constitutional:      General: He is not in acute distress.    Appearance: Normal appearance. He is obese. He is not ill-appearing or diaphoretic.  HENT:  Head: Normocephalic and atraumatic.     Right Ear: Tympanic membrane, ear canal and external ear normal.     Left Ear: Ear canal and external ear normal.     Ears:     Comments: Left black impacted cerumen     Nose: Nose normal.     Mouth/Throat:     Mouth: Mucous membranes are moist.     Pharynx: Oropharynx is clear.  Eyes:     Extraocular Movements: Extraocular movements intact.     Conjunctiva/sclera: Conjunctivae normal.     Pupils: Pupils are equal, round, and reactive to light.  Cardiovascular:     Rate and Rhythm: Normal rate and regular rhythm.     Pulses: Normal pulses.     Heart sounds: Normal heart sounds.  Abdominal:     General: Bowel sounds are normal. There is no distension.     Palpations: Abdomen is soft. There is no mass.     Tenderness: There is no abdominal tenderness.     Hernia: No hernia is present.  Musculoskeletal:     Cervical back: Normal range of motion and neck supple.     Left knee: Decreased range of motion: wearing a knee brace. Tenderness present.     Right lower leg: No edema.     Left lower leg: No edema.  Skin:    General: Skin is warm and dry.  Neurological:     General: No focal deficit present.     Mental Status: He is alert and oriented to person, place, and time. Mental status is at baseline.  Psychiatric:        Mood and  Affect: Mood normal.        Behavior: Behavior normal.        Thought Content: Thought content normal.        Judgment: Judgment normal.   Lab Results  Component Value Date   TSH 3.842 12/30/2007   Lab Results  Component Value Date   WBC 7.6 08/03/2020   HGB 16.5 08/03/2020   HCT 50.4 08/03/2020   MCV 83 08/03/2020   PLT 195 08/03/2020   Lab Results  Component Value Date   NA 141 08/03/2020   K 4.5 08/03/2020   CO2 25 08/03/2020   GLUCOSE 87 08/03/2020   BUN 14 08/03/2020   CREATININE 0.74 (L) 08/03/2020   BILITOT 0.6 08/03/2020   ALKPHOS 60 08/03/2020   AST 14 08/03/2020   ALT 21 08/03/2020   PROT 6.6 08/03/2020   ALBUMIN 4.4 08/03/2020   CALCIUM 9.6 08/03/2020   ANIONGAP 9 04/11/2018   Lab Results  Component Value Date   CHOL 167 08/03/2020   Lab Results  Component Value Date   HDL 40 08/03/2020   Lab Results  Component Value Date   LDLCALC 104 (H) 08/03/2020   Lab Results  Component Value Date   TRIG 130 08/03/2020   Lab Results  Component Value Date   CHOLHDL 4.2 08/03/2020   No results found for: HGBA1C

## 2021-08-03 NOTE — Patient Instructions (Addendum)
Encouraged to purchase Debrox wax removal kit over the counter. Drops (3-4) should be instilled twice daily x3 days, then the ear flushed with warm water on the 4th day.   Reminder: please return after mid-September for your flu shot. If you receive this elsewhere, such as your pharmacy, please let us know so we can get this documented in your chart.

## 2021-08-04 ENCOUNTER — Telehealth: Payer: Self-pay | Admitting: Family Medicine

## 2021-08-04 DIAGNOSIS — H02831 Dermatochalasis of right upper eyelid: Secondary | ICD-10-CM | POA: Diagnosis not present

## 2021-08-04 DIAGNOSIS — H02834 Dermatochalasis of left upper eyelid: Secondary | ICD-10-CM | POA: Diagnosis not present

## 2021-08-04 DIAGNOSIS — H01004 Unspecified blepharitis left upper eyelid: Secondary | ICD-10-CM | POA: Diagnosis not present

## 2021-08-04 DIAGNOSIS — H01001 Unspecified blepharitis right upper eyelid: Secondary | ICD-10-CM | POA: Diagnosis not present

## 2021-08-04 LAB — CBC WITH DIFFERENTIAL/PLATELET
Basophils Absolute: 0.1 10*3/uL (ref 0.0–0.2)
Basos: 1 %
EOS (ABSOLUTE): 0.2 10*3/uL (ref 0.0–0.4)
Eos: 3 %
Hematocrit: 45.8 % (ref 37.5–51.0)
Hemoglobin: 15.3 g/dL (ref 13.0–17.7)
Immature Grans (Abs): 0 10*3/uL (ref 0.0–0.1)
Immature Granulocytes: 0 %
Lymphocytes Absolute: 1.8 10*3/uL (ref 0.7–3.1)
Lymphs: 22 %
MCH: 27.7 pg (ref 26.6–33.0)
MCHC: 33.4 g/dL (ref 31.5–35.7)
MCV: 83 fL (ref 79–97)
Monocytes Absolute: 0.7 10*3/uL (ref 0.1–0.9)
Monocytes: 8 %
Neutrophils Absolute: 5.4 10*3/uL (ref 1.4–7.0)
Neutrophils: 66 %
Platelets: 178 10*3/uL (ref 150–450)
RBC: 5.52 x10E6/uL (ref 4.14–5.80)
RDW: 13.4 % (ref 11.6–15.4)
WBC: 8.1 10*3/uL (ref 3.4–10.8)

## 2021-08-04 LAB — CMP14+EGFR
ALT: 20 IU/L (ref 0–44)
AST: 13 IU/L (ref 0–40)
Albumin/Globulin Ratio: 2 (ref 1.2–2.2)
Albumin: 4 g/dL (ref 3.8–4.8)
Alkaline Phosphatase: 55 IU/L (ref 44–121)
BUN/Creatinine Ratio: 19 (ref 10–24)
BUN: 13 mg/dL (ref 8–27)
Bilirubin Total: 0.5 mg/dL (ref 0.0–1.2)
CO2: 24 mmol/L (ref 20–29)
Calcium: 8.8 mg/dL (ref 8.6–10.2)
Chloride: 108 mmol/L — ABNORMAL HIGH (ref 96–106)
Creatinine, Ser: 0.68 mg/dL — ABNORMAL LOW (ref 0.76–1.27)
Globulin, Total: 2 g/dL (ref 1.5–4.5)
Glucose: 87 mg/dL (ref 65–99)
Potassium: 4.2 mmol/L (ref 3.5–5.2)
Sodium: 145 mmol/L — ABNORMAL HIGH (ref 134–144)
Total Protein: 6 g/dL (ref 6.0–8.5)
eGFR: 103 mL/min/{1.73_m2} (ref >59)

## 2021-08-04 LAB — LIPID PANEL
Chol/HDL Ratio: 3.9 ratio (ref 0.0–5.0)
Cholesterol, Total: 163 mg/dL (ref 100–199)
HDL: 42 mg/dL (ref >39)
LDL Chol Calc (NIH): 102 mg/dL — ABNORMAL HIGH (ref 0–99)
Triglycerides: 106 mg/dL (ref 0–149)
VLDL Cholesterol Cal: 19 mg/dL (ref 5–40)

## 2021-08-04 NOTE — Telephone Encounter (Signed)
Bernard Hill from [facility] called   Ozempic is on manufacture back order for all strengthens.

## 2021-08-04 NOTE — Telephone Encounter (Signed)
Please offer patient a referral to Almyra Free to see if she can get something else covered for him. If yes, please advise that he bring bank statements for the past month for him and his spouse (if applicable) to his appointment with her. A referral to CCM will need to be placed.

## 2021-08-08 ENCOUNTER — Telehealth: Payer: Self-pay

## 2021-08-08 NOTE — Chronic Care Management (AMB) (Signed)
  Chronic Care Management   Note  08/08/2021 Name: Bernard Hill MRN: [id number] DOB: 1956-12-17  Bernard Hill is a 65 y.o. year old male who is a primary care patient of Loman Brooklyn, FNP. I reached out to [name] by phone today in response to a referral sent by [name]'s PCP, Loman Brooklyn, FNP      [name] was given information about Chronic Care Management services today including:  CCM service includes personalized support from designated clinical staff supervised by his physician, including individualized plan of care and coordination with other care providers 24/7 contact phone numbers for assistance for urgent and routine care needs. Service will only be billed when office clinical staff spend 20 minutes or more in a month to coordinate care. Only one practitioner may furnish and bill the service in a calendar month. The patient may stop CCM services at any time (effective at the end of the month) by phone call to the office staff. The patient will be responsible for cost sharing (co-pay) of up to 20% of the service fee (after annual deductible is met).  Patient agreed to services and verbal consent obtained.   Follow up plan: Telephone appointment with care management team member scheduled for:08/23/2021  Noreene Larsson, [address], [place], [place] [address] Direct Dial: [phone] [email or web address]

## 2021-08-12 DIAGNOSIS — M1711 Unilateral primary osteoarthritis, right knee: Secondary | ICD-10-CM | POA: Diagnosis not present

## 2021-08-19 DIAGNOSIS — M1711 Unilateral primary osteoarthritis, right knee: Secondary | ICD-10-CM | POA: Diagnosis not present

## 2021-08-23 ENCOUNTER — Ambulatory Visit (INDEPENDENT_AMBULATORY_CARE_PROVIDER_SITE_OTHER): Payer: Medicare Other | Admitting: Pharmacist

## 2021-08-23 DIAGNOSIS — Z6841 Body Mass Index (BMI) 40.0 and over, adult: Secondary | ICD-10-CM

## 2021-08-23 DIAGNOSIS — N400 Enlarged prostate without lower urinary tract symptoms: Secondary | ICD-10-CM

## 2021-08-23 DIAGNOSIS — E8881 Metabolic syndrome: Secondary | ICD-10-CM

## 2021-08-24 DIAGNOSIS — R972 Elevated prostate specific antigen [PSA]: Secondary | ICD-10-CM | POA: Diagnosis not present

## 2021-08-24 DIAGNOSIS — N400 Enlarged prostate without lower urinary tract symptoms: Secondary | ICD-10-CM | POA: Diagnosis not present

## 2021-08-26 DIAGNOSIS — M1711 Unilateral primary osteoarthritis, right knee: Secondary | ICD-10-CM | POA: Diagnosis not present

## 2021-09-01 NOTE — Patient Instructions (Signed)
Visit Information  PATIENT GOALS:  Goals Addressed               This Visit's Progress     Patient Stated     Metabolic Syndrome (pt-stated)        Current Barriers:  Unable to independently afford treatment regimen Unable to achieve control of metabolic syndrome   Pharmacist Clinical Goal(s):  Over the next 90 days, patient will verbalize ability to afford treatment regimen achieve control of weight as evidenced by improved metabolic syndrome through collaboration with PharmD and provider.    Interventions: 1:1 collaboration with Loman Brooklyn, FNP regarding development and update of comprehensive plan of care as evidenced by provider attestation and co-signature Inter-disciplinary care team collaboration (see longitudinal plan of care) Comprehensive medication review performed; medication list updated in electronic medical record  Overweight/Obesity Complicated by HLD: Unable to achieve goal weight loss through lifestyle modification alone; current treatment:ozempic '1mg'$  -- insurance will not cover ozempic '2mg'$ --will attempt to get trulicity 4.5  Medications/Strategies previously tried: ozempic Baseline weight: 311; most recent weight: 299 Current exercise: walks Recommended ozempic '2mg'$  vs trulicity 4.5 Assessed patient finances. Will discuss patient assistance programs and finances with patient; collaboration with provider;will discuss with patient at f/u   Patient Goals/Self-Care Activities Over the next 90 days, patient will:  - take medications as prescribed collaborate with provider on medication access solutions  Follow Up Plan: Telephone follow up appointment with care management team member scheduled for: 2 WEEKS         The patient verbalized understanding of instructions, educational materials, and care plan provided today and declined offer to receive copy of patient instructions, educational materials, and care plan.   Telephone follow up appointment  with care management team member scheduled for:2 WEEKS  Signature Regina Eck, PharmD, BCPS Clinical Pharmacist, [place]  II Phone [phone]

## 2021-09-01 NOTE — Progress Notes (Signed)
Chronic Care Management Pharmacy Note  08/23/2021 Name:  Bernard Hill MRN:  [id number] DOB:  03-28-1956  Summary: metabolic syndrome  Recommendations/Changes made from today's visit: Overweight/Obesity Complicated by HLD: Unable to achieve goal weight loss through lifestyle modification alone; current treatment:ozempic 30m -- insurance will not cover ozempic 273m-will attempt to get trulicity 4.5  Medications/Strategies previously tried: ozempic Baseline weight: 311; most recent weight: 299 Current exercise: walks Recommended ozempic 27m71ms trulicity 4.5 Assessed patient finances. Will discuss patient assistance programs and finances with patient; collaboration with provider; will discuss with patient   Follow Up Plan: Telephone follow up appointment with care management team member scheduled for: 2 WEEKS  Subjective: Bernard Hill an [age]. year old male who is a primary patient of JoyLoman BrooklynNP.  The CCM team was consulted for assistance with disease management and care coordination needs.    Collaboration with provider  for  patient assistance/metabolic syndrome  in response to provider referral for pharmacy case management and/or care coordination services.   Consent to Services:  The patient was given information about Chronic Care Management services, agreed to services, and gave verbal consent prior to initiation of services.  Please see initial visit note for detailed documentation.   Patient Care Team: JoyLoman BrooklynNP as PCP - General (Family Medicine) [facility]Lavera [facility]PH[facility]armacist) PhiNicholaus BloomD (Anesthesiology) JohCelestia KhatD (Optometry)  Objective:  Lab Results  Component Value Date   CREATININE 0.68 (L) 08/03/2021   CREATININE 0.74 (L) 08/03/2020   CREATININE 0.61 04/11/2018    No results found for: HGBA1C Last diabetic Eye exam: No results found for: HMDIABEYEEXA  Last diabetic Foot exam: No results found for:  HMDIABFOOTEX      Component Value Date/Time   CHOL 163 08/03/2021 1633   TRIG 106 08/03/2021 1633   HDL 42 08/03/2021 1633   CHOLHDL 3.9 08/03/2021 1633   LDLCALC 102 (H) 08/03/2021 1633    Hepatic Function Latest Ref Rng & Units 08/03/2021 08/03/2020 03/30/2009  Total Protein 6.0 - 8.5 g/dL 6.0 6.6 6.8  Albumin 3.8 - 4.8 g/dL 4.0 4.4 4.3  AST 0 - 40 IU/L '13 14 20  ' ALT 0 - 44 IU/L '20 21 28  ' Alk Phosphatase 44 - 121 IU/L 55 60 65  Total Bilirubin 0.0 - 1.2 mg/dL 0.5 0.6 0.9  Bilirubin, Direct 0.0 - 0.3 mg/dL - - 0.2    Lab Results  Component Value Date/Time   TSH 3.842 12/30/2007 10:37 PM    CBC Latest Ref Rng & Units 08/03/2021 08/03/2020 04/11/2018  WBC 3.4 - 10.8 x10E3/uL 8.1 7.6 6.1  Hemoglobin 13.0 - 17.7 g/dL 15.3 16.5 14.8  Hematocrit 37.5 - 51.0 % 45.8 50.4 45.0  Platelets 150 - 450 x10E3/uL 178 195 178    No results found for: VD25OH  Clinical ASCVD: No  The 10-year ASCVD risk score (Arnett DK, et al., 2019) is: 13.2%   Values used to calculate the score:     Age: [age]     Sex: Male     Is Non-Hispanic African American: No     Diabetic: No     Tobacco smoker: No     Systolic Blood Pressure: 128396Hg     Is BP treated: No     HDL Cholesterol: 42 mg/dL     Total Cholesterol: 163 mg/dL    Other: (CHADS2VASc if Afib, PHQ9 if depression, MMRC or CAT for COPD, ACT, DEXA)  Social History   Tobacco Use  Smoking Status Former   Packs/day: 1.00   Years: 10.00   Pack years: 10.00   Types: Cigarettes   Quit date: 08/29/1979   Years since quitting: 42.0  Smokeless Tobacco Never   BP Readings from Last 3 Encounters:  08/03/21 128/70  06/23/21 138/80  05/03/21 135/81   Pulse Readings from Last 3 Encounters:  08/03/21 73  06/23/21 84  05/03/21 67   Wt Readings from Last 3 Encounters:  08/03/21 299 lb 12.8 oz (136 kg)  06/23/21 297 lb (134.7 kg)  05/03/21 (!) 301 lb 3.2 oz (136.6 kg)    Assessment: Review of patient past medical history, allergies,  medications, health status, including review of consultants reports, laboratory and other test data, was performed as part of comprehensive evaluation and provision of chronic care management services.   SDOH:  (Social Determinants of Health) assessments and interventions performed:    CCM Care Plan  Allergies  Allergen Reactions   Lisinopril Other (See Comments)    Medications Reviewed Today     Reviewed by Lavera Guise, [facility] (Pharmacist) on 08/23/21 at 1045  Med List Status: <None>   Medication Order Taking? Sig Documenting Provider Last Dose Status Informant  cholecalciferol (VITAMIN D) 1000 units tablet [id number] No Take 1,000 Units by mouth daily. [provider] Taking Active Self  Semaglutide, 1 MG/DOSE, (OZEMPIC, 1 MG/DOSE,) 4 MG/3ML SOPN [id number] No Inject 1 mg into the skin once a week. Loman Brooklyn, FNP Taking Active   tadalafil (CIALIS) 5 MG tablet [id number] No Take 5 mg by mouth daily.  [provider] Taking Active Self            Patient Active Problem List   Diagnosis Date Noted   Metabolic syndrome 08/65/7846   Chronic pain syndrome 12/29/2019   ED (erectile dysfunction) 12/29/2019   Morbid obesity with BMI of 40.0-44.9, adult ([place]) 04/05/2017   Osteoarthritis of left knee 01/24/2017   PITUITARY INSUFFICIENCY 04/01/2009   NEOPLASM OF UNCERTAIN BEHAVIOR OF SKIN 07/27/2008   Hypotestosteronism 01/01/2008   BPH with elevated PSA 12/30/2007   CONSTIPATION, HX OF 12/30/2007    Immunization History  Administered Date(s) Administered   Fluad Quad(high Dose 65+) 11/02/2020   Influenza, Seasonal, Injecte, Preservative Fre 10/09/2019   Pneumococcal Conjugate-13 10/09/2019, 11/02/2020   Tdap 10/09/2019   Tetanus 12/26/2015    Conditions to be addressed/monitored: Metabolic syndrome  Care Plan : PHARMD MEDICATION MANAGEMENT  Updates made by Lavera Guise, [place] since 09/01/2021 12:00 AM     Problem: DISEASE PROGRESSION PREVENTION       Long-Range Goal: metabolic syndrome   This Visit's Progress: Not on track  Priority: High  Note:   Current Barriers:  Unable to independently afford treatment regimen Unable to achieve control of metabolic syndrome   Pharmacist Clinical Goal(s):  Over the next 90 days, patient will verbalize ability to afford treatment regimen achieve control of weight as evidenced by improved metabolic syndrome  through collaboration with PharmD and provider.    Interventions: 1:1 collaboration with Loman Brooklyn, FNP regarding development and update of comprehensive plan of care as evidenced by provider attestation and co-signature Inter-disciplinary care team collaboration (see longitudinal plan of care) Comprehensive medication review performed; medication list updated in electronic medical record  Overweight/Obesity Complicated by HLD: Unable to achieve goal weight loss through lifestyle modification alone; current treatment:ozempic 41m -- insurance will not cover ozempic 276m-will attempt to get trulicity 4.5  Medications/Strategies previously  tried: ozempic Baseline weight: 311; most recent weight: 299 Current exercise: walks Recommended ozempic 63m vs trulicity 4.5 Assessed patient finances. Will discuss patient assistance programs and finances with patient; collaboration with provider;will discuss with patient at f/u   Patient Goals/Self-Care Activities Over the next 90 days, patient will:  - take medications as prescribed collaborate with provider on medication access solutions  Follow Up Plan: Telephone follow up appointment with care management team member scheduled for: 2 WEEKS      Medication Assistance:  TBD  [facility] [address]Phone: [phone]: [phone]  Follow Up:  Patient agrees to Care Plan and Follow-up.  Plan: Telephone follow up appointment  with care management team member scheduled for:  2 WEEKS   JRegina Eck PharmD, BCPS Clinical Pharmacist, [place] Phone [phone]

## 2021-09-09 ENCOUNTER — Telehealth: Payer: Medicare Other

## 2021-09-16 ENCOUNTER — Telehealth: Payer: Medicare Other

## 2021-09-23 DIAGNOSIS — G894 Chronic pain syndrome: Secondary | ICD-10-CM | POA: Diagnosis not present

## 2021-09-23 DIAGNOSIS — M961 Postlaminectomy syndrome, not elsewhere classified: Secondary | ICD-10-CM | POA: Diagnosis not present

## 2021-09-23 DIAGNOSIS — M25562 Pain in left knee: Secondary | ICD-10-CM | POA: Diagnosis not present

## 2021-09-23 DIAGNOSIS — Z79891 Long term (current) use of opiate analgesic: Secondary | ICD-10-CM | POA: Diagnosis not present

## 2021-10-14 DIAGNOSIS — R3911 Hesitancy of micturition: Secondary | ICD-10-CM | POA: Diagnosis not present

## 2021-10-14 DIAGNOSIS — R311 Benign essential microscopic hematuria: Secondary | ICD-10-CM | POA: Diagnosis not present

## 2021-10-19 ENCOUNTER — Ambulatory Visit (INDEPENDENT_AMBULATORY_CARE_PROVIDER_SITE_OTHER): Payer: Medicare Other | Admitting: Pharmacist

## 2021-10-19 DIAGNOSIS — E785 Hyperlipidemia, unspecified: Secondary | ICD-10-CM

## 2021-10-19 DIAGNOSIS — E8881 Metabolic syndrome: Secondary | ICD-10-CM

## 2021-10-19 NOTE — Patient Instructions (Addendum)
Visit Information  PATIENT GOALS:  Goals Addressed               This Visit's Progress     Patient Stated     Metabolic Syndrome (pt-stated)        Current Barriers:  Unable to independently afford treatment regimen Unable to achieve control of metabolic syndrome   Pharmacist Clinical Goal(s):  Over the next 90 days, patient will verbalize ability to afford treatment regimen achieve control of weight as evidenced by improved metabolic syndrome through collaboration with PharmD and provider.    Interventions: 1:1 collaboration with Loman Brooklyn, FNP regarding development and update of comprehensive plan of care as evidenced by provider attestation and co-signature Inter-disciplinary care team collaboration (see longitudinal plan of care) Comprehensive medication review performed; medication list updated in electronic medical record  Overweight/Obesity Complicated by HLD: Unable to achieve goal weight loss through lifestyle modification alone; current treatment: Ozempic 1mg  -- insurance will not cover ozempic 2mg  Denies personal and family history of Medullary thyroid cancer (MTC) Application to be sent to [employer] patient assistance program for Ozempic 2mg  sq weekly (not on med list yet, will add when med is delivered/started) Medications/Strategies previously tried: ozempic 1mg  sq weekly Baseline weight: 311; most recent weight: 299 Current exercise: walks Recommended ozempic 2mg   Assessed patient finances. Awaiting patient financials;  [employer] application prepared-->to be faxed when patient brings in financials; collaboration with provider  **Increase in Ozempic dose will potentially benefit patient's lipid panel Lipid Panel     Component Value Date/Time   CHOL 163 08/03/2021 1633   TRIG 106 08/03/2021 1633   HDL 42 08/03/2021 1633   CHOLHDL 3.9 08/03/2021 1633   LDLCALC 102 (H) 08/03/2021 1633   LABVLDL 19 08/03/2021 1633    Patient Goals/Self-Care  Activities Over the next 90 days, patient will:  - take medications as prescribed collaborate with provider on medication access solutions  Follow Up Plan: Telephone follow up appointment with care management team member scheduled for: 1 month        The patient verbalized understanding of instructions, educational materials, and care plan provided today and declined offer to receive copy of patient instructions, educational materials, and care plan.   Telephone follow up appointment with care management team member scheduled for: 1 month  Regina Eck, PharmD, BCPS Clinical Pharmacist, [place]  [facility] Phone [phone]

## 2021-10-19 NOTE — Progress Notes (Signed)
Chronic Care Management Pharmacy Note  10/19/2021 Name:  Bernard Hill MRN:  [id number] DOB:  [birth year]  Summary: metabolic syndrome  Recommendations/Changes made from today's visit: Overweight/Obesity Complicated by HLD: Unable to achieve goal weight loss through lifestyle modification alone; current treatment: Ozempic 552m -- insurance will not cover ozempic 245mDenies personal and family history of Medullary thyroid cancer (MTC) Application to be sent to [employer] patient assistance program for Ozempic 52m23mq weekly (not on med list yet, will add when med is delivered/started) Medications/Strategies previously tried: ozempic 1mg34m weekly Baseline weight: 311; most recent weight: 299 Current exercise: walks Recommended ozempic 52mg 55msessed patient finances. Awaiting patient financials;  [employer] application prepared-->to be faxed when patient brings in financials; collaboration with provider  **Increase in Ozempic dose will potentially benefit patient's lipid panel Lipid Panel     Component Value Date/Time   CHOL 163 08/03/2021 1633   TRIG 106 08/03/2021 1633   HDL 42 08/03/2021 1633   CHOLHDL 3.9 08/03/2021 1633   LDLCALC 102 (H) 08/03/2021 1633   LABVLDL 19 08/03/2021 1633   Follow Up Plan: Telephone follow up appointment with care management team member scheduled for: 1 month  Subjective: Bernard Hill 65 y.65 year old male who is a primary patient of JoyceLoman Brooklyn.  The CCM team was consulted for assistance with disease management and care coordination needs.    Engaged with patient by telephone for follow up visit in response to provider referral for pharmacy case management and/or care coordination services.   Consent to Services:  The patient was given information about Chronic Care Management services, agreed to services, and gave verbal consent prior to initiation of services.  Please see initial visit note for detailed documentation.    Patient Care Team: JoyceLoman Brooklyn as PCP - General (Family Medicine) PruitLavera Guise ([facility]) PhillNicholaus Bloom(Anesthesiology) JohnsCelestia Khat(Optometry)  Objective:  Lab Results  Component Value Date   CREATININE 0.68 (L) 08/03/2021   CREATININE 0.74 (L) 08/03/2020   CREATININE 0.61 04/11/2018    No results found for: HGBA1C Last diabetic Eye exam: No results found for: HMDIABEYEEXA  Last diabetic Foot exam: No results found for: HMDIABFOOTEX      Component Value Date/Time   CHOL 163 08/03/2021 1633   TRIG 106 08/03/2021 1633   HDL 42 08/03/2021 1633   CHOLHDL 3.9 08/03/2021 1633   LDLCALC 102 (H) 08/03/2021 1633    Hepatic Function Latest Ref Rng & Units 08/03/2021 08/03/2020 03/30/2009  Total Protein 6.0 - 8.5 g/dL 6.0 6.6 6.8  Albumin 3.8 - 4.8 g/dL 4.0 4.4 4.3  AST 0 - 40 IU/L '13 14 20  ' ALT 0 - 44 IU/L '20 21 28  ' Alk Phosphatase 44 - 121 IU/L 55 60 65  Total Bilirubin 0.0 - 1.2 mg/dL 0.5 0.6 0.9  Bilirubin, Direct 0.0 - 0.3 mg/dL - - 0.2    Lab Results  Component Value Date/Time   TSH 3.842 12/30/2007 10:37 PM    CBC Latest Ref Rng & Units 08/03/2021 08/03/2020 04/11/2018  WBC 3.4 - 10.8 x10E3/uL 8.1 7.6 6.1  Hemoglobin 13.0 - 17.7 g/dL 15.3 16.5 14.8  Hematocrit 37.5 - 51.0 % 45.8 50.4 45.0  Platelets 150 - 450 x10E3/uL 178 195 178    No results found for: VD25OH  Clinical ASCVD: No  The 10-year ASCVD risk score (Arnett DK, et al., 2019) is: 14.2%   Values  used to calculate the score:     Age: 30 years     Sex: Male     Is Non-Hispanic African American: No     Diabetic: No     Tobacco smoker: No     Systolic Blood Pressure: 742 mmHg     Is BP treated: No     HDL Cholesterol: 42 mg/dL     Total Cholesterol: 163 mg/dL    Other: (CHADS2VASc if Afib, PHQ9 if depression, MMRC or CAT for COPD, ACT, DEXA)  Social History   Tobacco Use  Smoking Status Former   Packs/day: 1.00   Years: 10.00   Pack years: 10.00   Types:  Cigarettes   Quit date: 08/29/1979   Years since quitting: 42.1  Smokeless Tobacco Never   BP Readings from Last 3 Encounters:  08/03/21 128/70  06/23/21 138/80  05/03/21 135/81   Pulse Readings from Last 3 Encounters:  08/03/21 73  06/23/21 84  05/03/21 67   Wt Readings from Last 3 Encounters:  08/03/21 299 lb 12.8 oz (136 kg)  06/23/21 297 lb (134.7 kg)  05/03/21 (!) 301 lb 3.2 oz (136.6 kg)    Assessment: Review of patient past medical history, allergies, medications, health status, including review of consultants reports, laboratory and other test data, was performed as part of comprehensive evaluation and provision of chronic care management services.   SDOH:  (Social Determinants of Health) assessments and interventions performed:    CCM Care Plan  Allergies  Allergen Reactions   Lisinopril Other (See Comments)    Medications Reviewed Today     Reviewed by Lavera Guise, [facility] (Pharmacist) on 08/23/21 at 1045  Med List Status: <None>   Medication Order Taking? Sig Documenting Provider Last Dose Status Informant  cholecalciferol (VITAMIN D) 1000 units tablet [id number] No Take 1,000 Units by mouth daily. [provider] Taking Active Self  Semaglutide, 1 MG/DOSE, (OZEMPIC, 1 MG/DOSE,) 4 MG/3ML SOPN [id number] No Inject 1 mg into the skin once a week. Loman Brooklyn, FNP Taking Active   tadalafil (CIALIS) 5 MG tablet [id number] No Take 5 mg by mouth daily.  [provider] Taking Active Self            Patient Active Problem List   Diagnosis Date Noted   Metabolic syndrome 60/09/9322   Chronic pain syndrome 12/29/2019   ED (erectile dysfunction) 12/29/2019   Morbid obesity with BMI of 40.0-44.9, adult ([place]) 04/05/2017   Osteoarthritis of left knee 01/24/2017   PITUITARY INSUFFICIENCY 04/01/2009   NEOPLASM OF UNCERTAIN BEHAVIOR OF SKIN 07/27/2008   Hypotestosteronism 01/01/2008   BPH with elevated PSA 12/30/2007   CONSTIPATION, HX OF  12/30/2007    Immunization History  Administered Date(s) Administered   Fluad Quad(high Dose 65+) 11/02/2020   Influenza, Seasonal, Injecte, Preservative Fre 10/09/2019   Pneumococcal Conjugate-13 10/09/2019, 11/02/2020   Tdap 10/09/2019   Tetanus 12/26/2015    Conditions to be addressed/monitored: OBESITY/ HLD/METABOLIC SYNDROME  Care Plan : PHARMD MEDICATION MANAGEMENT  Updates made by Lavera Guise, [place] since 10/19/2021 12:00 AM     Problem: DISEASE PROGRESSION PREVENTION      Long-Range Goal: metabolic syndrome   Recent Progress: Not on track  Priority: High  Note:   Current Barriers:  Unable to independently afford treatment regimen Unable to achieve control of metabolic syndrome   Pharmacist Clinical Goal(s):  Over the next 90 days, patient will verbalize ability to afford treatment regimen achieve control of weight as evidenced  by improved metabolic syndrome  through collaboration with PharmD and provider.    Interventions: 1:1 collaboration with Loman Brooklyn, FNP regarding development and update of comprehensive plan of care as evidenced by provider attestation and co-signature Inter-disciplinary care team collaboration (see longitudinal plan of care) Comprehensive medication review performed; medication list updated in electronic medical record  Overweight/Obesity Complicated by HLD: Unable to achieve goal weight loss through lifestyle modification alone; current treatment: Ozempic 70m -- insurance will not cover ozempic 256mDenies personal and family history of Medullary thyroid cancer (MTC) Application to be sent to [employer] patient assistance program for Ozempic 35m15mq weekly (not on med list yet, will add when med is delivered/started) Medications/Strategies previously tried: ozempic 1mg85m weekly Baseline weight: 311; most recent weight: 299 Current exercise: walks Recommended ozempic 35mg 45msessed patient finances. Awaiting patient financials;   [employer] application prepared-->to be faxed when patient brings in financials; collaboration with provider  **Increase in Ozempic dose will potentially benefit patient's lipid panel Lipid Panel     Component Value Date/Time   CHOL 163 08/03/2021 1633   TRIG 106 08/03/2021 1633   HDL 42 08/03/2021 1633   CHOLHDL 3.9 08/03/2021 1633   LDLCALC 102 (H) 08/03/2021 1633   LABVLDL 19 08/03/2021 1633    Patient Goals/Self-Care Activities Over the next 90 days, patient will:  - take medications as prescribed collaborate with provider on medication access solutions  Follow Up Plan: Telephone follow up appointment with care management team member scheduled for: 1 month      Medication Assistance: Application for ozempic  medication assistance program. in process.  Anticipated assistance start date tbd.  See plan of care for additional detail.  [place]  [facility] [place]: [phone] Up:  Patient agrees to Care Plan and Follow-up.  Plan: Telephone follow up appointment with care management team member scheduled for:  1 month    JulieRegina EckrmD, BCPS Clinical Pharmacist, [place] [phone]

## 2021-10-24 DIAGNOSIS — E785 Hyperlipidemia, unspecified: Secondary | ICD-10-CM | POA: Diagnosis not present

## 2021-10-28 ENCOUNTER — Ambulatory Visit (INDEPENDENT_AMBULATORY_CARE_PROVIDER_SITE_OTHER): Payer: Medicare Other

## 2021-10-28 ENCOUNTER — Other Ambulatory Visit: Payer: Self-pay

## 2021-10-28 DIAGNOSIS — Z23 Encounter for immunization: Secondary | ICD-10-CM

## 2021-10-28 NOTE — Addendum Note (Signed)
Addended byCarrolyn Leigh on: 10/28/2021 04:47 PM   Modules accepted: Orders

## 2021-11-03 ENCOUNTER — Ambulatory Visit (INDEPENDENT_AMBULATORY_CARE_PROVIDER_SITE_OTHER): Payer: Medicare Other | Admitting: Pharmacist

## 2021-11-03 DIAGNOSIS — E785 Hyperlipidemia, unspecified: Secondary | ICD-10-CM

## 2021-11-03 DIAGNOSIS — E8881 Metabolic syndrome: Secondary | ICD-10-CM

## 2021-11-03 NOTE — Patient Instructions (Signed)
Visit Information Current Barriers:  Unable to independently afford treatment regimen Unable to achieve control of metabolic syndrome   Pharmacist Clinical Goal(s):  Over the next 90 days, patient will verbalize ability to afford treatment regimen achieve control of weight as evidenced by improved metabolic syndrome through collaboration with PharmD and provider.    Interventions: 1:1 collaboration with Loman Brooklyn, FNP regarding development and update of comprehensive plan of care as evidenced by provider attestation and co-signature Inter-disciplinary care team collaboration (see longitudinal plan of care) Comprehensive medication review performed; medication list updated in electronic medical record  Overweight/Obesity Complicated by HLD: Unable to achieve goal weight loss through lifestyle modification alone; current treatment: Ozempic 1mg  -- insurance will not cover ozempic 2mg  Denies personal and family history of Medullary thyroid cancer (MTC) Application sent to [employer] patient assistance program for Ozempic 2mg  sq weekly (not on med list yet, will add when med is delivered/started) Sample of ozempic 2mg  given today Medications/Strategies previously tried: ozempic 1mg  sq weekly Baseline weight: 311; most recent weight: 299 Current exercise: walks Recommended ozempic 2mg   Assessed patient finances. Awaiting patient financials;  [employer] application faxed today; collaboration with provider  **Increase in Ozempic dose will potentially benefit patient's lipid panel Lipid Panel     Component Value Date/Time   CHOL 163 08/03/2021 1633   TRIG 106 08/03/2021 1633   HDL 42 08/03/2021 1633   CHOLHDL 3.9 08/03/2021 1633   LDLCALC 102 (H) 08/03/2021 1633   LABVLDL 19 08/03/2021 1633     Patient Goals/Self-Care Activities Over the next 90 days, patient will:  - take medications as prescribed collaborate with provider on medication access solutions  Follow Up Plan:  Telephone follow up appointment with care management team member scheduled for: 1 month   The patient verbalized understanding of instructions, educational materials, and care plan provided today and declined offer to receive copy of patient instructions, educational materials, and care plan.   Telephone follow up appointment with care management team member scheduled for: 1 month  Signature Regina Eck, PharmD, BCPS Clinical Pharmacist, [place]  [place] Phone [phone]

## 2021-11-03 NOTE — Progress Notes (Signed)
Chronic Care Management Pharmacy Note  11/03/2021 Name:  Bernard Hill MRN:  [id number] DOB:  08-31-1956  Summary: metabolic syndrome  Recommendations/Changes made from today's visit: Overweight/Obesity Complicated by HLD: Unable to achieve goal weight loss through lifestyle modification alone; current treatment: Ozempic 79m -- insurance will not cover ozempic 214mDenies personal and family history of Medullary thyroid cancer (MTC) Application sent to [employer] patient assistance program for Ozempic 35m37mq weekly (not on med list yet, will add when med is delivered/started) Sample of ozempic 35mg57mven today Medications/Strategies previously tried: ozempic 1mg 68mweekly Baseline weight: 311; most recent weight: 299 Current exercise: walks Recommended ozempic 35mg  71messed patient finances. Awaiting patient financials;  [employer] application faxed today; collaboration with provider  **Increase in Ozempic dose will potentially benefit patient's lipid panel Lipid Panel     Component Value Date/Time   CHOL 163 08/03/2021 1633   TRIG 106 08/03/2021 1633   HDL 42 08/03/2021 1633   CHOLHDL 3.9 08/03/2021 1633   LDLCALC 102 (H) 08/03/2021 1633   LABVLDL 19 08/03/2021 1633     Patient Goals/Self-Care Activities Over the next 90 days, patient will:  - take medications as prescribed collaborate with provider on medication access solutions  Follow Up Plan: Telephone follow up appointment with care management team member scheduled for: 1 month   Plan:  Subjective: MaynarMACKENZY Hill 65 y.o41year old male who is a primary patient of Bernard,Bernard Hill  The CCM team was consulted for assistance with disease management and care coordination needs.    Engaged with patient face to face for follow up visit in response to provider referral for pharmacy case management and/or care coordination services.   Consent to Services:  The patient was given information about  Chronic Care Management services, agreed to services, and gave verbal consent prior to initiation of services.  Please see initial visit note for detailed documentation.   Patient Care Team: Bernard,Bernard Brooklynas PCP - General (Family Medicine) PruittLavera Hill[facility]) Bernard BloomAnesthesiology) Bernard KhatOptometry)   Objective:  Lab Results  Component Value Date   CREATININE 0.68 (L) 08/03/2021   CREATININE 0.74 (L) 08/03/2020   CREATININE 0.61 04/11/2018    No results found for: HGBA1C Last diabetic Eye exam: No results found for: HMDIABEYEEXA  Last diabetic Foot exam: No results found for: HMDIABFOOTEX      Component Value Date/Time   CHOL 163 08/03/2021 1633   TRIG 106 08/03/2021 1633   HDL 42 08/03/2021 1633   CHOLHDL 3.9 08/03/2021 1633   LDLCALC 102 (H) 08/03/2021 1633    Hepatic Function Latest Ref Rng & Units 08/03/2021 08/03/2020 03/30/2009  Total Protein 6.0 - 8.5 g/dL 6.0 6.6 6.8  Albumin 3.8 - 4.8 g/dL 4.0 4.4 4.3  AST 0 - 40 IU/L '13 14 20  ' ALT 0 - 44 IU/L '20 21 28  ' Alk Phosphatase 44 - 121 IU/L 55 60 65  Total Bilirubin 0.0 - 1.2 mg/dL 0.5 0.6 0.9  Bilirubin, Direct 0.0 - 0.3 mg/dL - - 0.2    Lab Results  Component Value Date/Time   TSH 3.842 12/30/2007 10:37 PM    CBC Latest Ref Rng & Units 08/03/2021 08/03/2020 04/11/2018  WBC 3.4 - 10.8 x10E3/uL 8.1 7.6 6.1  Hemoglobin 13.0 - 17.7 g/dL 15.3 16.5 14.8  Hematocrit 37.5 - 51.0 % 45.8 50.4 45.0  Platelets 150 - 450 x10E3/uL 178 195  178    No results found for: VD25OH  Clinical ASCVD: No  The 10-year ASCVD risk score (Arnett DK, et al., 2019) is: 14.2%   Values used to calculate the score:     Age: 76 years     Sex: Male     Is Non-Hispanic African American: No     Diabetic: No     Tobacco smoker: No     Systolic Blood Pressure: 622 mmHg     Is BP treated: No     HDL Cholesterol: 42 mg/dL     Total Cholesterol: 163 mg/dL    Other: (CHADS2VASc if Afib, PHQ9 if  depression, MMRC or CAT for COPD, ACT, DEXA)  Social History   Tobacco Use  Smoking Status Former   Packs/day: 1.00   Years: 10.00   Pack years: 10.00   Types: Cigarettes   Quit date: 08/29/1979   Years since quitting: 42.2  Smokeless Tobacco Never   BP Readings from Last 3 Encounters:  08/03/21 128/70  06/23/21 138/80  05/03/21 135/81   Pulse Readings from Last 3 Encounters:  08/03/21 73  06/23/21 84  05/03/21 67   Wt Readings from Last 3 Encounters:  08/03/21 299 lb 12.8 oz (136 kg)  06/23/21 297 lb (134.7 kg)  05/03/21 (!) 301 lb 3.2 oz (136.6 kg)    Assessment: Review of patient past medical history, allergies, medications, health status, including review of consultants reports, laboratory and other test data, was performed as part of comprehensive evaluation and provision of chronic care management services.   SDOH:  (Social Determinants of Health) assessments and interventions performed:    CCM Care Plan  Allergies  Allergen Reactions   Lisinopril Other (See Comments)    Medications Reviewed Today     Reviewed by Bernard Hill, [facility] (Pharmacist) on 08/23/21 at 1045  Med List Status: <None>   Medication Order Taking? Sig Documenting Provider Last Dose Status Informant  cholecalciferol (VITAMIN D) 1000 units tablet [id number] No Take 1,000 Units by mouth daily. [provider] Taking Active Self  Semaglutide, 1 MG/DOSE, (OZEMPIC, 1 MG/DOSE,) 4 MG/3ML SOPN [id number] No Inject 1 mg into the skin once a week. Bernard Brooklyn, FNP Taking Active   tadalafil (CIALIS) 5 MG tablet [id number] No Take 5 mg by mouth daily.  [provider] Taking Active Self            Patient Active Problem List   Diagnosis Date Noted   Metabolic syndrome 97/01/6377   Chronic pain syndrome 12/29/2019   ED (erectile dysfunction) 12/29/2019   Morbid obesity with BMI of 40.0-44.9, adult ([facility]) 04/05/2017   Osteoarthritis of left knee 01/24/2017   PITUITARY  INSUFFICIENCY 04/01/2009   NEOPLASM OF UNCERTAIN BEHAVIOR OF SKIN 07/27/2008   Hypotestosteronism 01/01/2008   BPH with elevated PSA 12/30/2007   CONSTIPATION, HX OF 12/30/2007    Immunization History  Administered Date(s) Administered   Fluad Quad(high Dose 65+) 11/02/2020, 10/28/2021   Influenza, Seasonal, Injecte, Preservative Fre 10/09/2019   PNEUMOCOCCAL CONJUGATE-20 10/28/2021   Pneumococcal Conjugate-13 10/09/2019, 11/02/2020   Tdap 10/09/2019   Tetanus 12/26/2015    Conditions to be addressed/monitored: HLD  Care Plan : PHARMD MEDICATION MANAGEMENT  Updates made by Bernard Hill, [facility] since 11/03/2021 12:00 AM     Problem: DISEASE PROGRESSION PREVENTION      Long-Range Goal: metabolic syndrome   Recent Progress: Not on track  Priority: High  Note:   Current Barriers:  Unable to independently afford treatment  regimen Unable to achieve control of metabolic syndrome   Pharmacist Clinical Goal(s):  Over the next 90 days, patient will verbalize ability to afford treatment regimen achieve control of weight as evidenced by improved metabolic syndrome  through collaboration with PharmD and provider.    Interventions: 1:1 collaboration with Bernard Brooklyn, FNP regarding development and update of comprehensive plan of care as evidenced by provider attestation and co-signature Inter-disciplinary care team collaboration (see longitudinal plan of care) Comprehensive medication review performed; medication list updated in electronic medical record  Overweight/Obesity Complicated by HLD: Unable to achieve goal weight loss through lifestyle modification alone; current treatment: Ozempic 45m -- insurance will not cover ozempic 264mDenies personal and family history of Medullary thyroid cancer (MTC) Application sent to [employer] patient assistance program for Ozempic 72m13mq weekly (not on med list yet, will add when med is delivered/started) Sample of ozempic 72mg19mven  today Medications/Strategies previously tried: ozempic 1mg 44mweekly Baseline weight: 311; most recent weight: 299 Current exercise: walks Recommended ozempic 72mg  472messed patient finances. Awaiting patient financials;  [employer] application faxed today; collaboration with provider  **Increase in Ozempic dose will potentially benefit patient's lipid panel Lipid Panel     Component Value Date/Time   CHOL 163 08/03/2021 1633   TRIG 106 08/03/2021 1633   HDL 42 08/03/2021 1633   CHOLHDL 3.9 08/03/2021 1633   LDLCALC 102 (H) 08/03/2021 1633   LABVLDL 19 08/03/2021 1633    Patient Goals/Self-Care Activities Over the next 90 days, patient will:  - take medications as prescribed collaborate with provider on medication access solutions  Follow Up Plan: Telephone follow up appointment with care management team member scheduled for: 1 month      Medication Assistance: Application for ozempic  medication assistance program. in process.  Anticipated assistance start date TBD.  See plan of care for additional detail.  Patient[facility] [address] [place] [address]: [phone] Up:  Patient agrees to Care Plan and Follow-up.  Plan: Telephone follow up appointment with care management team member scheduled for:  1 month    Hether Anselmo Regina EckmD, BCPS Clinical Pharmacist, [place] [phone]

## 2021-11-21 NOTE — Progress Notes (Signed)
Received notification from [place] regarding approval for [place]. Patient assistance approved UNTIL 11/23/21.  ORDER FULFILLED BUT NOT YET SHIPPED. WILL NEED TO RE [place] FOR 2023 AROUND END OF DEC   Phone: [phone]

## 2021-11-23 DIAGNOSIS — E8881 Metabolic syndrome: Secondary | ICD-10-CM

## 2021-11-23 DIAGNOSIS — E785 Hyperlipidemia, unspecified: Secondary | ICD-10-CM | POA: Diagnosis not present

## 2021-12-05 ENCOUNTER — Telehealth: Payer: Self-pay | Admitting: Family Medicine

## 2021-12-05 NOTE — Telephone Encounter (Signed)
Ozempic 2mg  came in today for patient. Patient aware and will come by and pick up.

## 2022-02-02 ENCOUNTER — Telehealth: Payer: Self-pay | Admitting: Family Medicine

## 2022-02-02 NOTE — Telephone Encounter (Signed)
Patient has questions about his Prior Authorization for his medication. Please call back.

## 2022-02-02 NOTE — Telephone Encounter (Signed)
Left message for pt to call back and give more info  What medication?  What Prior auth? Nothing on file.Marland KitchenMarland KitchenMarland Kitchen

## 2022-02-06 NOTE — Telephone Encounter (Signed)
Medication is Ozempic 0.25 and Bernard Hill got him approved and needs to know if he needs to do another application to get medication. Has three more shots left.02/09 MM

## 2022-02-07 NOTE — Telephone Encounter (Signed)
He will need to reapply for patient assistance He will probably need to fill at local drug store until he receives assistance Assistance is backordered until April (up to 6 weeks) I need newest household financial documents We do not have samples at this time Ozempic sent to [facility] to see if patient can fill and pay for a couple of months   Will route to [employer] for [employer] scheduling; telephone is fine as long as patient brings forms to me in advance

## 2022-02-07 NOTE — Telephone Encounter (Signed)
Patient aware and verbalized understanding. °

## 2022-02-08 DIAGNOSIS — L821 Other seborrheic keratosis: Secondary | ICD-10-CM | POA: Diagnosis not present

## 2022-02-08 DIAGNOSIS — D485 Neoplasm of uncertain behavior of skin: Secondary | ICD-10-CM | POA: Diagnosis not present

## 2022-02-08 DIAGNOSIS — D239 Other benign neoplasm of skin, unspecified: Secondary | ICD-10-CM | POA: Diagnosis not present

## 2022-02-08 DIAGNOSIS — L570 Actinic keratosis: Secondary | ICD-10-CM | POA: Diagnosis not present

## 2022-02-10 NOTE — Telephone Encounter (Signed)
Pt has been scheduled for 03/21/2022

## 2022-02-24 DIAGNOSIS — G894 Chronic pain syndrome: Secondary | ICD-10-CM | POA: Diagnosis not present

## 2022-02-24 DIAGNOSIS — M961 Postlaminectomy syndrome, not elsewhere classified: Secondary | ICD-10-CM | POA: Diagnosis not present

## 2022-02-24 DIAGNOSIS — Z79891 Long term (current) use of opiate analgesic: Secondary | ICD-10-CM | POA: Diagnosis not present

## 2022-02-24 DIAGNOSIS — G5711 Meralgia paresthetica, right lower limb: Secondary | ICD-10-CM | POA: Diagnosis not present

## 2022-03-02 ENCOUNTER — Telehealth: Payer: Self-pay | Admitting: Pharmacist

## 2022-03-02 NOTE — Telephone Encounter (Signed)
Can you let patient know I have cancelled his appt for this month (originally scheduled for 03/21/22) because we are ONLY able to complete patient assistance for Ozempic in patient's with type 2 diabetes (patient does not currently have type 2 diabetes)?  These are the FDA & program rules.  He brought me his financial form--I can shred or give back to him--his choice. ? ?If you would like to for to call him, let me know! ? ?THANK YOU! ?

## 2022-03-06 NOTE — Telephone Encounter (Signed)
Pt has been notified and states that he will come by to pick up his forms.  ? ?Thank you, ?Noreene Larsson, RMA ?Care Guide, Embedded Care Coordination ?[facility]  ?[place], [place] [address] ?Direct Dial: [phone] ?[email or web address] ?Website: [email or web address]  ? ?

## 2022-03-21 ENCOUNTER — Telehealth: Payer: Medicare Other

## 2022-03-27 DIAGNOSIS — Z79891 Long term (current) use of opiate analgesic: Secondary | ICD-10-CM | POA: Diagnosis not present

## 2022-03-27 DIAGNOSIS — G5711 Meralgia paresthetica, right lower limb: Secondary | ICD-10-CM | POA: Diagnosis not present

## 2022-03-27 DIAGNOSIS — M961 Postlaminectomy syndrome, not elsewhere classified: Secondary | ICD-10-CM | POA: Diagnosis not present

## 2022-03-27 DIAGNOSIS — G894 Chronic pain syndrome: Secondary | ICD-10-CM | POA: Diagnosis not present

## 2022-05-04 DIAGNOSIS — M5416 Radiculopathy, lumbar region: Secondary | ICD-10-CM | POA: Diagnosis not present

## 2022-05-04 DIAGNOSIS — G5711 Meralgia paresthetica, right lower limb: Secondary | ICD-10-CM | POA: Diagnosis not present

## 2022-05-04 DIAGNOSIS — G894 Chronic pain syndrome: Secondary | ICD-10-CM | POA: Diagnosis not present

## 2022-05-04 DIAGNOSIS — Z79891 Long term (current) use of opiate analgesic: Secondary | ICD-10-CM | POA: Diagnosis not present

## 2022-06-01 DIAGNOSIS — Z79891 Long term (current) use of opiate analgesic: Secondary | ICD-10-CM | POA: Diagnosis not present

## 2022-06-01 DIAGNOSIS — M5416 Radiculopathy, lumbar region: Secondary | ICD-10-CM | POA: Diagnosis not present

## 2022-06-01 DIAGNOSIS — G894 Chronic pain syndrome: Secondary | ICD-10-CM | POA: Diagnosis not present

## 2022-06-01 DIAGNOSIS — G5711 Meralgia paresthetica, right lower limb: Secondary | ICD-10-CM | POA: Diagnosis not present

## 2022-06-05 ENCOUNTER — Ambulatory Visit (INDEPENDENT_AMBULATORY_CARE_PROVIDER_SITE_OTHER): Payer: Medicare Other

## 2022-06-05 VITALS — Ht 70.0 in | Wt 299.0 lb

## 2022-06-05 DIAGNOSIS — Z0000 Encounter for general adult medical examination without abnormal findings: Secondary | ICD-10-CM

## 2022-06-05 NOTE — Patient Instructions (Signed)
Bernard Hill , Thank you for taking time to come for your Medicare Wellness Visit. I appreciate your ongoing commitment to your health goals. Please review the following plan we discussed and let me know if I can assist you in the future.   Screening recommendations/referrals: Colonoscopy: Done 04/19/2018 Repeat in 7 years.  Recommended yearly ophthalmology/optometry visit for glaucoma screening and checkup Recommended yearly dental visit for hygiene and checkup  Vaccinations: Influenza vaccine: Done 10/28/2021 Repeat annually  Pneumococcal vaccine: Done 11/02/2020 and 10/28/2021 Tdap vaccine: Done 10/09/2019 Repeat in 10 years  Shingles vaccine: Discussed.   Covid-19: Declined.  Advanced directives: Please bring a copy of your health care power of attorney and living will to the office to be added to your chart at your convenience.   Conditions/risks identified: Aim for 30 minutes of exercise or brisk walking, 6-8 glasses of water, and 5 servings of fruits and vegetables each day.   Next appointment: Follow up in one year for your annual wellness visit. 2024.  Preventive Care 21 Years and Older, Male  Preventive care refers to lifestyle choices and visits with your health care provider that can promote health and wellness. What does preventive care include? A yearly physical exam. This is also called an annual well check. Dental exams once or twice a year. Routine eye exams. Ask your health care provider how often you should have your eyes checked. Personal lifestyle choices, including: Daily care of your teeth and gums. Regular physical activity. Eating a healthy diet. Avoiding tobacco and drug use. Limiting alcohol use. Practicing safe sex. Taking low doses of aspirin every day. Taking vitamin and mineral supplements as recommended by your health care provider. What happens during an annual well check? The services and screenings done by your health care provider during your  annual well check will depend on your age, overall health, lifestyle risk factors, and family history of disease. Counseling  Your health care provider may ask you questions about your: Alcohol use. Tobacco use. Drug use. Emotional well-being. Home and relationship well-being. Sexual activity. Eating habits. History of falls. Memory and ability to understand (cognition). Work and work Statistician. Screening  You may have the following tests or measurements: Height, weight, and BMI. Blood pressure. Lipid and cholesterol levels. These may be checked every 5 years, or more frequently if you are over 23 years old. Skin check. Lung cancer screening. You may have this screening every year starting at age 2 if you have a 30-pack-year history of smoking and currently smoke or have quit within the past 15 years. Fecal occult blood test (FOBT) of the stool. You may have this test every year starting at age 57. Flexible sigmoidoscopy or colonoscopy. You may have a sigmoidoscopy every 5 years or a colonoscopy every 10 years starting at age 77. Prostate cancer screening. Recommendations will vary depending on your family history and other risks. Hepatitis C blood test. Hepatitis B blood test. Sexually transmitted disease (STD) testing. Diabetes screening. This is done by checking your blood sugar (glucose) after you have not eaten for a while (fasting). You may have this done every 1-3 years. Abdominal aortic aneurysm (AAA) screening. You may need this if you are a current or former smoker. Osteoporosis. You may be screened starting at age 35 if you are at high risk. Talk with your health care provider about your test results, treatment options, and if necessary, the need for more tests. Vaccines  Your health care provider may recommend certain vaccines, such as:  Influenza vaccine. This is recommended every year. Tetanus, diphtheria, and acellular pertussis (Tdap, Td) vaccine. You may need a Td  booster every 10 years. Zoster vaccine. You may need this after age 25. Pneumococcal 13-valent conjugate (PCV13) vaccine. One dose is recommended after age 81. Pneumococcal polysaccharide (PPSV23) vaccine. One dose is recommended after age 56. Talk to your health care provider about which screenings and vaccines you need and how often you need them. This information is not intended to replace advice given to you by your health care provider. Make sure you discuss any questions you have with your health care provider. Document Released: 01/07/2016 Document Revised: 08/30/2016 Document Reviewed: 10/12/2015 Elsevier Interactive Patient Education  2017 [place] Prevention in the Home Falls can cause injuries. They can happen to people of all ages. There are many things you can do to make your home safe and to help prevent falls. What can I do on the outside of my home? Regularly fix the edges of walkways and driveways and fix any cracks. Remove anything that might make you trip as you walk through a door, such as a raised step or threshold. Trim any bushes or trees on the path to your home. Use bright outdoor lighting. Clear any walking paths of anything that might make someone trip, such as rocks or tools. Regularly check to see if handrails are loose or broken. Make sure that both sides of any steps have handrails. Any raised decks and porches should have guardrails on the edges. Have any leaves, snow, or ice cleared regularly. Use sand or salt on walking paths during winter. Clean up any spills in your garage right away. This includes oil or grease spills. What can I do in the bathroom? Use night lights. Install grab bars by the toilet and in the tub and shower. Do not use towel bars as grab bars. Use non-skid mats or decals in the tub or shower. If you need to sit down in the shower, use a plastic, non-slip stool. Keep the floor dry. Clean up any water that spills on the floor  as soon as it happens. Remove soap buildup in the tub or shower regularly. Attach bath mats securely with double-sided non-slip rug tape. Do not have throw rugs and other things on the floor that can make you trip. What can I do in the bedroom? Use night lights. Make sure that you have a light by your bed that is easy to reach. Do not use any sheets or blankets that are too big for your bed. They should not hang down onto the floor. Have a firm chair that has side arms. You can use this for support while you get dressed. Do not have throw rugs and other things on the floor that can make you trip. What can I do in the kitchen? Clean up any spills right away. Avoid walking on wet floors. Keep items that you use a lot in easy-to-reach places. If you need to reach something above you, use a strong step stool that has a grab bar. Keep electrical cords out of the way. Do not use floor polish or wax that makes floors slippery. If you must use wax, use non-skid floor wax. Do not have throw rugs and other things on the floor that can make you trip. What can I do with my stairs? Do not leave any items on the stairs. Make sure that there are handrails on both sides of the stairs and use them.  Fix handrails that are broken or loose. Make sure that handrails are as long as the stairways. Check any carpeting to make sure that it is firmly attached to the stairs. Fix any carpet that is loose or worn. Avoid having throw rugs at the top or bottom of the stairs. If you do have throw rugs, attach them to the floor with carpet tape. Make sure that you have a light switch at the top of the stairs and the bottom of the stairs. If you do not have them, ask someone to add them for you. What else can I do to help prevent falls? Wear shoes that: Do not have high heels. Have rubber bottoms. Are comfortable and fit you well. Are closed at the toe. Do not wear sandals. If you use a stepladder: Make sure that it is  fully opened. Do not climb a closed stepladder. Make sure that both sides of the stepladder are locked into place. Ask someone to hold it for you, if possible. Clearly mark and make sure that you can see: Any grab bars or handrails. First and last steps. Where the edge of each step is. Use tools that help you move around (mobility aids) if they are needed. These include: Canes. Walkers. Scooters. Crutches. Turn on the lights when you go into a dark area. Replace any light bulbs as soon as they burn out. Set up your furniture so you have a clear path. Avoid moving your furniture around. If any of your floors are uneven, fix them. If there are any pets around you, be aware of where they are. Review your medicines with your doctor. Some medicines can make you feel dizzy. This can increase your chance of falling. Ask your doctor what other things that you can do to help prevent falls. This information is not intended to replace advice given to you by your health care provider. Make sure you discuss any questions you have with your health care provider. Document Released: 10/07/2009 Document Revised: 05/18/2016 Document Reviewed: 01/15/2015 Elsevier Interactive Patient Education  2017 [employer].

## 2022-06-05 NOTE — Progress Notes (Signed)
Subjective:   Bernard Hill is a 66 y.o. male who presents for Medicare Annual/Subsequent preventive examination. Virtual Visit via Telephone Note  I connected with  Bernard Hill on 06/05/22 at 11:15 AM EDT by telephone and verified that I am speaking with the correct person using two identifiers.  Location: Patient: HOME Provider: [facility] Persons participating in the virtual visit: patient/Nurse Health Advisor   I discussed the limitations, risks, security and privacy concerns of performing an evaluation and management service by telephone and the availability of in person appointments. The patient expressed understanding and agreed to proceed.  Interactive audio and video telecommunications were attempted between this nurse and patient, however failed, due to patient having technical difficulties OR patient did not have access to video capability.  We continued and completed visit with audio only.  Some vital signs may be absent or patient reported.   Chriss Driver, LPN  Review of Systems     Cardiac Risk Factors include: advanced age (>58mn, >>6women);male gender;sedentary lifestyle;obesity (BMI >30kg/m2);Other (see comment), Risk factor comments: Osteoarthritis of L knee, chronic pain, pituitary insufficnecy, meatbolic syndrome     Objective:    Today's Vitals   06/05/22 1111  Weight: 299 lb (135.6 kg)  Height: '5\' 10"'$  (1.778 m)   Body mass index is 42.9 kg/m.     06/05/2022   11:17 AM 05/25/2021    3:58 PM 08/16/2020    8:46 AM 04/19/2018   10:02 AM 04/11/2018   10:00 AM 05/22/2015    9:18 AM 09/01/2014    9:56 AM  Advanced Directives  Does Patient Have a Medical Advance Directive? No No No Yes No No No  Type of Advance Directive    Living will     Would patient like information on creating a medical advance directive? No - Patient declined No - Patient declined No - Patient declined  No - Patient declined  No - patient declined information    Current  Medications (verified) Outpatient Encounter Medications as of 06/05/2022  Medication Sig   cholecalciferol (VITAMIN D) 1000 units tablet Take 1,000 Units by mouth daily.   LYRICA 100 MG capsule Take 100 mg by mouth 3 (three) times daily.   Semaglutide, 1 MG/DOSE, (OZEMPIC, 1 MG/DOSE,) 4 MG/3ML SOPN Inject 1 mg into the skin once a week. (Patient not taking: Reported on 06/05/2022)   tadalafil (CIALIS) 5 MG tablet Take 5 mg by mouth daily.    Testosterone 1.62 % GEL SMARTSIG:2-3 pump Topical Daily   No facility-administered encounter medications on file as of 06/05/2022.    Allergies (verified) Lisinopril   History: Past Medical History:  Diagnosis Date   Arthritis    BPH (benign prostatic hypertrophy)    Chronic back pain    History of kidney stones    Hypogonadism in male    Past Surgical History:  Procedure Laterality Date   CATARACT EXTRACTION W/PHACO Right 08/16/2020   Procedure: CATARACT EXTRACTION PHACO AND INTRAOCULAR LENS PLACEMENT [place];  Surgeon: WBaruch Goldmann MD;  Location: [facility];  Service: Ophthalmology;  Laterality: Right;  CDE: 6.39   CATARACT EXTRACTION W/PHACO Left 09/06/2020   Procedure: CATARACT EXTRACTION PHACO AND INTRAOCULAR LENS PLACEMENT LEFT EYE;  Surgeon: WBaruch Goldmann MD;  Location: [facility];  Service: Ophthalmology;  Laterality: Left;  CDE: 5.96   COLONOSCOPY     COLONOSCOPY WITH PROPOFOL N/A 04/19/2018   Procedure: COLONOSCOPY WITH PROPOFOL;  Surgeon: RRogene Houston MD;  [facility]: [facility];  Service: Endoscopy;  Laterality: N/A;  12:25   HAND SURGERY Right 2017   palm near middle finger    LUMBAR FUSION  90,94,96   x3   POLYPECTOMY  04/19/2018   Procedure: POLYPECTOMY;  Surgeon: Rogene Houston, MD;  Location: [facility];  Service: Endoscopy;;  colon   REPAIR EXTENSOR TENDON Right 09/01/2014   Procedure: EXPLORATION REPAIR FLEXOR SUPERFICIAL TENDON RIGHT MIDDLE FINGER  ;  Surgeon: Daryll Brod, MD;  Location: [facility];  Service:  Orthopedics;  Laterality: Right;   Family History  Problem Relation Age of Onset   Cancer Mother        breast    Heart disease Mother    Hypertension Sister    Cancer Brother        bladder and colon    Asthma Daughter    Hypertension Daughter    Other Neg Hx    Social History   Socioeconomic History   Marital status: Married    Spouse name: [name]   Number of children: 2   Years of education: Not on file   Highest education level: 12th grade  Occupational History   Occupation: truck Geophysicist/field seismologist    Comment: part time   Tobacco Use   Smoking status: Former    Packs/day: 1.00    Years: 10.00    Total pack years: 10.00    Types: Cigarettes    Quit date: 08/29/1979    Years since quitting: 42.7   Smokeless tobacco: Never  Vaping Use   Vaping Use: Never used  Substance and Sexual Activity   Alcohol use: No   Drug use: No   Sexual activity: Not Currently    Birth control/protection: None  Other Topics Concern   Not on file  Social History Narrative   Not on file   Social Determinants of Health   Financial Resource Strain: Low Risk  (06/05/2022)   Overall Financial Resource Strain (CARDIA)    Difficulty of Paying Living Expenses: Not hard at all  Food Insecurity: No Food Insecurity (06/05/2022)   Hunger Vital Sign    Worried About Running Out of Food in the Last Year: Never true    [place] in the Last Year: Never true  Transportation Needs: No Transportation Needs (06/05/2022)   PRAPARE - Hydrologist (Medical): No    Lack of Transportation (Non-Medical): No  Physical Activity: Insufficiently Active (06/05/2022)   Exercise Vital Sign    Days of Exercise per Week: 3 days    Minutes of Exercise per Session: 30 Hill  Stress: No Stress Concern Present (06/05/2022)   [employer]    Feeling of Stress : Not at all  Social Connections: [place] (06/05/2022)   Social  Connection and Isolation Panel [NHANES]    Frequency of Communication with Friends and Family: More than three times a week    Frequency of Social Gatherings with Friends and Family: More than three times a week    Attends Religious Services: More than 4 times per year    Active Member of [employer] or Organizations: Yes    Attends Music therapist: More than 4 times per year    Marital Status: Married    Tobacco Counseling Counseling given: Not Answered   Clinical Intake:  Pre-visit preparation completed: Yes  Pain : No/denies pain     BMI - recorded: 42.9 Nutritional Status: BMI >  30  Obese Nutritional Risks: None Diabetes: No  How often do you need to have someone help you when you read instructions, pamphlets, or other written materials from your doctor or pharmacy?: 1 - Never  Diabetic?NO  Interpreter Needed?: No  Information entered by :: mj Gearl Baratta, lpn   Activities of Daily Living    06/05/2022   11:20 AM  In your present state of health, do you have any difficulty performing the following activities:  Hearing? 0  Vision? 0  Difficulty concentrating or making decisions? 0  Walking or climbing stairs? 0  Dressing or bathing? 0  Doing errands, shopping? 0  Preparing Food and eating ? N  Using the Toilet? N  In the past six months, have you accidently leaked urine? N  Do you have problems with loss of bowel control? N  Managing your Medications? N  Managing your Finances? N  Housekeeping or managing your Housekeeping? N    Patient Care Team: Loman Brooklyn, FNP as PCP - General (Family Medicine) Lavera Guise, [facility] (Pharmacist) Nicholaus Bloom, MD (Anesthesiology) Celestia Khat, OD (Optometry)  Indicate any recent Medical Services you may have received from other than [facility] providers in the past year (date may be approximate).     Assessment:   This is a routine wellness examination for [facility].  Hearing/Vision screen Hearing Screening  - Comments:: No hearing issues.  Vision Screening - Comments:: No glasses. [facility] [place]. 2022.  Dietary issues and exercise activities discussed: Current Exercise Habits: Home exercise routine, Type of exercise: walking, Time (Minutes): 30, Frequency (Times/Week): 3, Weekly Exercise (Minutes/Week): 90, Intensity: Mild, Exercise limited by: orthopedic condition(s);Other - see comments (Pitutitary insufficency, metabolic syndrome.)   Goals Addressed             This Visit's Progress    Exercise 150 Hill/wk Moderate Activity   Not on track    06/05/2022-Encouraged pt to increase as tolerated.      Weight (lb) < 200 lb (90.7 kg)   299 lb (135.6 kg)      Depression Screen    06/05/2022   11:15 AM 08/03/2021    3:17 PM 05/25/2021    3:59 PM 05/03/2021    3:34 PM 02/02/2021    3:37 PM 11/02/2020    4:04 PM 08/03/2020    3:00 PM  PHQ 2/9 Scores  PHQ - 2 Score 0 0 0 0 0 0 0  PHQ- 9 Score  0         Fall Risk    06/05/2022   11:17 AM 08/03/2021    3:17 PM 05/25/2021    3:59 PM 05/03/2021    3:34 PM 02/02/2021    3:37 PM  Fall Risk   Falls in the past year? 1 0 0 0 0  Number falls in past yr: 0      Injury with Fall? 1      Risk for fall due to : Impaired balance/gait;History of fall(s)      Follow up Falls prevention discussed        FALL RISK PREVENTION PERTAINING TO THE HOME:  Any stairs in or around the home? Yes  If so, are there any without handrails? No  Home free of loose throw rugs in walkways, pet beds, electrical cords, etc? Yes  Adequate lighting in your home to reduce risk of falls? Yes   ASSISTIVE DEVICES UTILIZED TO PREVENT FALLS:  Life alert? No  Use of a cane, walker  or w/c? No  Grab bars in the bathroom? Yes  Shower chair or bench in shower? Yes  Elevated toilet seat or a handicapped toilet? No   TIMED UP AND GO:  Was the test performed? No .  PHONE VISIT.    Cognitive Function:        06/05/2022   11:20 AM 05/25/2021    4:00 PM  6CIT Screen   What Year? 0 points 0 points  What month? 0 points 0 points  What time? 0 points 0 points  Count back from 20 0 points 0 points  Months in reverse 0 points 0 points  Repeat phrase 0 points 0 points  Total Score 0 points 0 points    Immunizations Immunization History  Administered Date(s) Administered   Fluad Quad(high Dose 65+) 11/02/2020, 10/28/2021   Influenza, Seasonal, Injecte, Preservative Fre 10/09/2019   PNEUMOCOCCAL CONJUGATE-20 10/28/2021   Pneumococcal Conjugate-13 10/09/2019, 11/02/2020   Tdap 10/09/2019   Tetanus 12/26/2015    TDAP status: Up to date  Flu Vaccine status: Up to date  Pneumococcal vaccine status: Up to date  Covid-19 vaccine status: Declined, Education has been provided regarding the importance of this vaccine but patient still declined. Advised may receive this vaccine at local pharmacy or Health Dept.or vaccine clinic. Aware to provide a copy of the vaccination record if obtained from local pharmacy or Health Dept. Verbalized acceptance and understanding.  Qualifies for Shingles Vaccine? No   Zostavax completed No   Shingrix Completed?: No.    Education has been provided regarding the importance of this vaccine. Patient has been advised to call insurance company to determine out of pocket expense if they have not yet received this vaccine. Advised may also receive vaccine at local pharmacy or Health Dept. Verbalized acceptance and understanding.  Screening Tests Health Maintenance  Topic Date Due   COVID-19 Vaccine (1) 06/21/2022 (Originally 03/14/1957)   Hepatitis C Screening  09/22/2022 (Originally 09/13/1974)   INFLUENZA VACCINE  07/25/2022   COLONOSCOPY (Pts 45-62yr Insurance coverage will need to be confirmed)  04/19/2025   TETANUS/TDAP  10/08/2029   Pneumonia Vaccine 66 Years old  Completed   HPV VACCINES  Aged Out   Zoster Vaccines- Shingrix  Discontinued    Health Maintenance  There are no preventive care reminders to display for  this patient.   Colorectal cancer screening: Type of screening: Colonoscopy. Completed 04/19/2018. Repeat every 7 years  Lung Cancer Screening: (Low Dose CT Chest recommended if Age 68-80years, 30 pack-year currently smoking OR have quit w/in 15years.) does not qualify.   Additional Screening:  Hepatitis C Screening: does qualify; Completed due  Vision Screening: Recommended annual ophthalmology exams for early detection of glaucoma and other disorders of the eye. Is the patient up to date with their annual eye exam?  Yes  Who is the provider or what is the name of the office in which the patient attends annual eye exams? [facility] If pt is not established with a provider, would they like to be referred to a provider to establish care? No .   Dental Screening: Recommended annual dental exams for proper oral hygiene  Community Resource Referral / Chronic Care Management: CRR required this visit?  No   CCM required this visit?  No      Plan:     I have personally reviewed and noted the following in the patient's chart:   Medical and social history Use of alcohol, tobacco or illicit drugs  Current  medications and supplements including opioid prescriptions. Patient is not currently taking opioid prescriptions. Functional ability and status Nutritional status Physical activity Advanced directives List of other physicians Hospitalizations, surgeries, and ER visits in previous 12 months Vitals Screenings to include cognitive, depression, and falls Referrals and appointments  In addition, I have reviewed and discussed with patient certain preventive protocols, quality metrics, and best practice recommendations. A written personalized care plan for preventive services as well as general preventive health recommendations were provided to patient.     Chriss Driver, LPN   01/09/5789   Nurse Notes: Discussed Shingrix. Pt declined because he states he never had Chicken  Pox as a child.

## 2022-06-06 ENCOUNTER — Ambulatory Visit (INDEPENDENT_AMBULATORY_CARE_PROVIDER_SITE_OTHER): Payer: Medicare Other | Admitting: Nurse Practitioner

## 2022-06-06 ENCOUNTER — Encounter: Payer: Self-pay | Admitting: Nurse Practitioner

## 2022-06-06 VITALS — BP 142/75 | HR 82 | Temp 98.6°F | Ht 67.0 in | Wt 314.0 lb

## 2022-06-06 DIAGNOSIS — R635 Abnormal weight gain: Secondary | ICD-10-CM

## 2022-06-06 DIAGNOSIS — Z6841 Body Mass Index (BMI) 40.0 and over, adult: Secondary | ICD-10-CM | POA: Diagnosis not present

## 2022-06-06 NOTE — Assessment & Plan Note (Signed)
Provided education to patient on low calorie diet, exercise,and increase hydration. Patient will benefit from [place] weight loss clinic. Patient will also start on wegovy or saxenda depending on what insurance will cover.

## 2022-06-06 NOTE — Patient Instructions (Signed)
Here is a guide to help us find out which weight loss medications will be covered by your insurance plan.  Please check out this web site  [email or web address] and follow the 3 simple steps.   There is also a phone number you can call if you do not have access to the Internet. [phone] (Monday- Friday 8am-8pm)  [employer] provides coverage information for more than 80% of the inquiries submitted!!   Calorie Counting for Weight Loss Calories are units of energy. Your body needs a certain number of calories from food to keep going throughout the day. When you eat or drink more calories than your body needs, your body stores the extra calories mostly as fat. When you eat or drink fewer calories than your body needs, your body burns fat to get the energy it needs. Calorie counting means keeping track of how many calories you eat and drink each day. Calorie counting can be helpful if you need to lose weight. If you eat fewer calories than your body needs, you should lose weight. Ask your health care provider what a healthy weight is for you. For calorie counting to work, you will need to eat the right number of calories each day to lose a healthy amount of weight per week. A dietitian can help you figure out how many calories you need in a day and will suggest ways to reach your calorie goal. A healthy amount of weight to lose each week is usually 1-2 lb (0.5-0.9 kg). This usually means that your daily calorie intake should be reduced by 500-750 calories. Eating 1,200-1,500 calories a day can help most women lose weight. Eating 1,500-1,800 calories a day can help most men lose weight. What do I need to know about calorie counting? Work with your health care provider or dietitian to determine how many calories you should get each day. To meet your daily calorie goal, you will need to: Find out how many calories are in each food that you would like to eat. Try to do this before you eat. Decide how much of the  food you plan to eat. Keep a food log. Do this by writing down what you ate and how many calories it had. To successfully lose weight, it is important to balance calorie counting with a healthy lifestyle that includes regular activity. Where do I find calorie information?  The number of calories in a food can be found on a Nutrition Facts label. If a food does not have a Nutrition Facts label, try to look up the calories online or ask your dietitian for help. Remember that calories are listed per serving. If you choose to have more than one serving of a food, you will have to multiply the calories per serving by the number of servings you plan to eat. For example, the label on a package of bread might say that a serving size is 1 slice and that there are 90 calories in a serving. If you eat 1 slice, you will have eaten 90 calories. If you eat 2 slices, you will have eaten 180 calories. How do I keep a food log? After each time that you eat, record the following in your food log as soon as possible: What you ate. Be sure to include toppings, sauces, and other extras on the food. How much you ate. This can be measured in cups, ounces, or number of items. How many calories were in each food and drink. The total number of calories   in the food you ate. Keep your food log near you, such as in a pocket-sized notebook or on an app or website on your mobile phone. Some programs will calculate calories for you and show you how many calories you have left to meet your daily goal. What are some portion-control tips? Know how many calories are in a serving. This will help you know how many servings you can have of a certain food. Use a measuring cup to measure serving sizes. You could also try weighing out portions on a kitchen scale. With time, you will be able to estimate serving sizes for some foods. Take time to put servings of different foods on your favorite plates or in your favorite bowls and cups so you  know what a serving looks like. Try not to eat straight from a food's packaging, such as from a bag or box. Eating straight from the package makes it hard to see how much you are eating and can lead to overeating. Put the amount you would like to eat in a cup or on a plate to make sure you are eating the right portion. Use smaller plates, glasses, and bowls for smaller portions and to prevent overeating. Try not to multitask. For example, avoid watching TV or using your computer while eating. If it is time to eat, sit down at a table and enjoy your food. This will help you recognize when you are full. It will also help you be more mindful of what and how much you are eating. What are tips for following this plan? Reading food labels Check the calorie count compared with the serving size. The serving size may be smaller than what you are used to eating. Check the source of the calories. Try to choose foods that are high in protein, fiber, and vitamins, and low in saturated fat, trans fat, and sodium. Shopping Read nutrition labels while you shop. This will help you make healthy decisions about which foods to buy. Pay attention to nutrition labels for low-fat or fat-free foods. These foods sometimes have the same number of calories or more calories than the full-fat versions. They also often have added sugar, starch, or salt to make up for flavor that was removed with the fat. Make a grocery list of lower-calorie foods and stick to it. Cooking Try to cook your favorite foods in a healthier way. For example, try baking instead of frying. Use low-fat dairy products. Meal planning Use more fruits and vegetables. One-half of your plate should be fruits and vegetables. Include lean proteins, such as chicken, turkey, and fish. Lifestyle Each week, aim to do one of the following: 150 minutes of moderate exercise, such as walking. 75 minutes of vigorous exercise, such as running. General  information Know how many calories are in the foods you eat most often. This will help you calculate calorie counts faster. Find a way of tracking calories that works for you. Get creative. Try different apps or programs if writing down calories does not work for you. What foods should I eat?  Eat nutritious foods. It is better to have a nutritious, high-calorie food, such as an avocado, than a food with few nutrients, such as a bag of potato chips. Use your calories on foods and drinks that will fill you up and will not leave you hungry soon after eating. Examples of foods that fill you up are nuts and nut butters, vegetables, lean proteins, and high-fiber foods such as whole grains. High-fiber   foods are foods with more than 5 g of fiber per serving. Pay attention to calories in drinks. Low-calorie drinks include water and unsweetened drinks. The items listed above may not be a complete list of foods and beverages you can eat. Contact a dietitian for more information. What foods should I limit? Limit foods or drinks that are not good sources of vitamins, minerals, or protein or that are high in unhealthy fats. These include: Candy. Other sweets. Sodas, specialty coffee drinks, alcohol, and juice. The items listed above may not be a complete list of foods and beverages you should avoid. Contact a dietitian for more information. How do I count calories when eating out? Pay attention to portions. Often, portions are much larger when eating out. Try these tips to keep portions smaller: Consider sharing a meal instead of getting your own. If you get your own meal, eat only half of it. Before you start eating, ask for a container and put half of your meal into it. When available, consider ordering smaller portions from the menu instead of full portions. Pay attention to your food and drink choices. Knowing the way food is cooked and what is included with the meal can help you eat fewer calories. If  calories are listed on the menu, choose the lower-calorie options. Choose dishes that include vegetables, fruits, whole grains, low-fat dairy products, and lean proteins. Choose items that are boiled, broiled, grilled, or steamed. Avoid items that are buttered, battered, fried, or served with cream sauce. Items labeled as crispy are usually fried, unless stated otherwise. Choose water, low-fat milk, unsweetened iced tea, or other drinks without added sugar. If you want an alcoholic beverage, choose a lower-calorie option, such as a glass of wine or light beer. Ask for dressings, sauces, and syrups on the side. These are usually high in calories, so you should limit the amount you eat. If you want a salad, choose a garden salad and ask for grilled meats. Avoid extra toppings such as bacon, cheese, or fried items. Ask for the dressing on the side, or ask for olive oil and vinegar or lemon to use as dressing. Estimate how many servings of a food you are given. Knowing serving sizes will help you be aware of how much food you are eating at restaurants. Where to find more information Centers for Disease Control and Prevention: www.cdc.gov U.S. Department of Agriculture: myplate.gov Summary Calorie counting means keeping track of how many calories you eat and drink each day. If you eat fewer calories than your body needs, you should lose weight. A healthy amount of weight to lose per week is usually 1-2 lb (0.5-0.9 kg). This usually means reducing your daily calorie intake by 500-750 calories. The number of calories in a food can be found on a Nutrition Facts label. If a food does not have a Nutrition Facts label, try to look up the calories online or ask your dietitian for help. Use smaller plates, glasses, and bowls for smaller portions and to prevent overeating. Use your calories on foods and drinks that will fill you up and not leave you hungry shortly after a meal. This information is not intended to  replace advice given to you by your health care provider. Make sure you discuss any questions you have with your health care provider. Document Revised: 01/22/2020 Document Reviewed: 01/22/2020 Elsevier Patient Education  2023 [employer]  

## 2022-06-06 NOTE — Progress Notes (Signed)
   Acute Office Visit  Subjective:     Patient ID: Bernard Hill, male    DOB: 1956/01/31, 66 y.o.   MRN: [id number]  Chief Complaint  Patient presents with   Weight Check    HPI Patient is in today for weight loss.  Patient is not currently on any medication for weight loss management.  He has tried exercise and calorie counting with self-directed dieting with no therapeutic effect.  Patient has used Ozempic in the past but was unable to refill medication due to insurance not covering because patient is not diabetic. Obesity has been a chronic problem for patient.   Review of Systems  Constitutional: Negative.   HENT: Negative.    Eyes: Negative.   Respiratory: Negative.    Cardiovascular: Negative.   Gastrointestinal: Negative.   Genitourinary: Negative.   Skin: Negative.  Negative for rash.  Neurological: Negative.   Psychiatric/Behavioral: Negative.    All other systems reviewed and are negative.       Objective:    BP (!) 142/75   Pulse 82   Temp 98.6 F (37 C)   Ht '5\' 7"'$  (1.702 m)   Wt (!) 314 lb (142.4 kg)   SpO2 96%   BMI 49.18 kg/m  BP Readings from Last 3 Encounters:  06/06/22 (!) 142/75  08/03/21 128/70  06/23/21 138/80   Wt Readings from Last 3 Encounters:  06/06/22 (!) 314 lb (142.4 kg)  06/05/22 299 lb (135.6 kg)  08/03/21 299 lb 12.8 oz (136 kg)      Physical Exam Vitals and nursing note reviewed.  Constitutional:      Appearance: Normal appearance.  HENT:     Head: Normocephalic.     Right Ear: External ear normal.     Left Ear: External ear normal.     Nose: Nose normal.     Mouth/Throat:     Mouth: Mucous membranes are moist.     Pharynx: Oropharynx is clear.  Eyes:     Conjunctiva/sclera: Conjunctivae normal.  Cardiovascular:     Rate and Rhythm: Normal rate and regular rhythm.     Pulses: Normal pulses.     Heart sounds: Normal heart sounds.  Abdominal:     General: Bowel sounds are normal.  Musculoskeletal:         General: Normal range of motion.  Skin:    General: Skin is warm.  Neurological:     General: No focal deficit present.     Mental Status: He is alert and oriented to person, place, and time.  Psychiatric:        Mood and Affect: Mood normal.        Behavior: Behavior normal.     No results found for any visits on 06/06/22.      Assessment & Plan:   Problem List Items Addressed This Visit       Other   Morbid obesity with BMI of 40.0-44.9, adult ([place]) - Primary    Provided education to patient on low calorie diet, exercise,and increase hydration. Patient will benefit from [place] weight loss clinic. Patient will also start on wegovy or saxenda depending on what insurance will cover.          No orders of the defined types were placed in this encounter.   Return if symptoms worsen or fail to improve.  Ivy Lynn, NP

## 2022-06-07 ENCOUNTER — Telehealth: Payer: Self-pay | Admitting: Family Medicine

## 2022-06-07 NOTE — Telephone Encounter (Signed)
Pts wife called to let provider know that pt called the [facility] as directed about the weight loss medicine and was told that his insurance would need a PA.

## 2022-06-08 NOTE — Telephone Encounter (Signed)
lmtcb

## 2022-06-08 NOTE — Telephone Encounter (Signed)
For which medication?

## 2022-06-08 NOTE — Telephone Encounter (Signed)
wegovy or saxenda   Patient states that [facility] gave him a number to call and he gave them both medications and they said they could not do either without a PA

## 2022-06-08 NOTE — Telephone Encounter (Signed)
His insurance does not cover these weight loss medications.

## 2022-06-16 NOTE — Telephone Encounter (Signed)
Lmtcb No call back  This encounter will be closed  

## 2022-08-08 DIAGNOSIS — M5416 Radiculopathy, lumbar region: Secondary | ICD-10-CM | POA: Diagnosis not present

## 2022-08-08 DIAGNOSIS — M961 Postlaminectomy syndrome, not elsewhere classified: Secondary | ICD-10-CM | POA: Diagnosis not present

## 2022-08-08 DIAGNOSIS — G894 Chronic pain syndrome: Secondary | ICD-10-CM | POA: Diagnosis not present

## 2022-08-08 DIAGNOSIS — Z79891 Long term (current) use of opiate analgesic: Secondary | ICD-10-CM | POA: Diagnosis not present

## 2022-09-12 ENCOUNTER — Ambulatory Visit (INDEPENDENT_AMBULATORY_CARE_PROVIDER_SITE_OTHER): Payer: Medicare Other | Admitting: Family Medicine

## 2022-09-12 ENCOUNTER — Encounter: Payer: Self-pay | Admitting: Family Medicine

## 2022-09-12 VITALS — BP 152/90 | HR 70 | Temp 97.8°F | Ht 67.0 in | Wt 317.2 lb

## 2022-09-12 DIAGNOSIS — Z23 Encounter for immunization: Secondary | ICD-10-CM | POA: Diagnosis not present

## 2022-09-12 DIAGNOSIS — H60543 Acute eczematoid otitis externa, bilateral: Secondary | ICD-10-CM

## 2022-09-12 MED ORDER — TRIAMCINOLONE ACETONIDE 0.1 % EX CREA: TOPICAL_CREAM | CUTANEOUS | 0 refills | Status: AC

## 2022-09-12 MED ORDER — LEVOCETIRIZINE DIHYDROCHLORIDE 5 MG PO TABS: 5.0000 mg | ORAL_TABLET | Freq: Every evening | ORAL | 99 refills | Status: DC | PRN

## 2022-09-12 NOTE — Progress Notes (Signed)
Subjective: ST:[id number] PCP: Loman Brooklyn, FNP LNL:[id number] L Bernard Hill is a 66 y.o. male presenting to clinic today for:  1. Otalgia Patient actually reports that he has been having irritation in the external ear such that he gets cracking and crusting.  He thought he might have a scab so he started placing Neosporin on this area.  The right feels worse than left.  Sometimes when he lays down at night he gets some drainage externally.  No bleeding reported.  No changes in hearing or reports of dizziness.  No fevers.  No rhinorrhea or other URI symptoms.   ROS: Per HPI  Allergies  Allergen Reactions   Lisinopril Other (See Comments)   Past Medical History:  Diagnosis Date   Arthritis    BPH (benign prostatic hypertrophy)    Chronic back pain    History of kidney stones    Hypogonadism in male     Current Outpatient Medications:    cholecalciferol (VITAMIN D) 1000 units tablet, Take 1,000 Units by mouth daily., Disp: , Rfl:    LYRICA 100 MG capsule, Take 100 mg by mouth 3 (three) times daily., Disp: , Rfl:    tadalafil (CIALIS) 5 MG tablet, Take 5 mg by mouth daily. , Disp: , Rfl:    Testosterone 1.62 % GEL, SMARTSIG:2-3 pump Topical Daily, Disp: , Rfl:  Social History   Socioeconomic History   Marital status: Married    Spouse name: [name]   Number of children: 2   Years of education: Not on file   Highest education level: 12th grade  Occupational History   Occupation: truck driver    Comment: part time   Tobacco Use   Smoking status: Former    Packs/day: 1.00    Years: 10.00    Total pack years: 10.00    Types: Cigarettes    Quit date: 08/29/1979    Years since quitting: 43.0   Smokeless tobacco: Never  Vaping Use   Vaping Use: Never used  Substance and Sexual Activity   Alcohol use: No   Drug use: No   Sexual activity: Not Currently    Birth control/protection: None  Other Topics Concern   Not on file  Social History Narrative   Not on file   Social  Determinants of Health   Financial Resource Strain: Low Risk  (06/05/2022)   Overall Financial Resource Strain (CARDIA)    Difficulty of Paying Living Expenses: Not hard at all  Food Insecurity: No Food Insecurity (06/05/2022)   Hunger Vital Sign    Worried About Running Out of Food in the Last Year: Never true    [place] in the Last Year: Never true  Transportation Needs: No Transportation Needs (06/05/2022)   PRAPARE - Hydrologist (Medical): No    Lack of Transportation (Non-Medical): No  Physical Activity: Insufficiently Active (06/05/2022)   Exercise Vital Sign    Days of Exercise per Week: 3 days    Minutes of Exercise per Session: 30 min  Stress: No Stress Concern Present (06/05/2022)   [place]    Feeling of Stress : Not at all  Social Connections: [place] (06/05/2022)   Social Connection and Isolation Panel [NHANES]    Frequency of Communication with Friends and Family: More than three times a week    Frequency of Social Gatherings with Friends and Family: More than three times a week  Attends Religious Services: More than 4 times per year    Active Member of Clubs or Organizations: Yes    Attends Archivist Meetings: More than 4 times per year    Marital Status: Married  Human resources officer Violence: Not At Risk (06/05/2022)   Humiliation, Afraid, Rape, and Kick questionnaire    Fear of Current or Ex-Partner: No    Emotionally Abused: No    Physically Abused: No    Sexually Abused: No   Family History  Problem Relation Age of Onset   Cancer Mother        breast    Heart disease Mother    Hypertension Sister    Cancer Brother        bladder and colon    Asthma Daughter    Hypertension Daughter    Other Neg Hx     Objective: Office vital signs reviewed. BP (!) 152/90   Pulse 70   Temp 97.8 F (36.6 C)   Ht '5\' 7"'$  (1.702 m)   Wt (!) 317 lb  3.2 oz (143.9 kg)   SpO2 94%   BMI 49.68 kg/m   Physical Examination:  General: Awake, alert, well-appearing male, No acute distress HEENT: External auditory canals with scaling, flaking skin.  He has a few areas of cracked skin.  No active bleeding or exudates appreciated.  TMs intact bilaterally with scant cerumen in the left external auditory canal.  No evidence of otitis media  Assessment/ Plan: 66 y.o. male   Dermatitis of both ear canals - Plan: triamcinolone cream (KENALOG) 0.1 %, levocetirizine (XYZAL) 5 MG tablet  Need for immunization against influenza - Plan: Flu Vaccine QUAD High Dose(Fluad)  He has dermatitis of the ear canals.  Triamcinolone cream apply to the affected areas twice daily until resolved.  Anticipate 7 to 10 days.  Also prescribed Xyzal for itch relief.  May use as needed.  No secondary bacterial infection.  Follow-up as needed  Influenza vaccination administered  No orders of the defined types were placed in this encounter.  No orders of the defined types were placed in this encounter.    Janora Norlander, DO [place] [phone]

## 2022-09-12 NOTE — Patient Instructions (Signed)
Use the cream up to twice daily until rash in ear resolves. Xyzal at bedtime as needed for itching/ allergies.  Dermatitis Dermatitis is redness, soreness, and swelling (inflammation) of the skin. Contact dermatitis is a reaction to something that touches the skin. There are two types of contact dermatitis: Irritant contact dermatitis. This happens when something bothers (irritates) your skin, like soap. Allergic contact dermatitis. This is caused when you are exposed to something that you are allergic to, such as poison ivy. What are the causes? Common causes of irritant contact dermatitis include: Makeup. Soaps. Detergents. Bleaches. Acids. Metals, such as nickel. Common causes of allergic contact dermatitis include: Plants. Chemicals. Jewelry. Latex. Medicines. Preservatives in products, such as clothing. What increases the risk? Having a job that exposes you to things that bother your skin. Having asthma or eczema. What are the signs or symptoms? Symptoms may happen anywhere the irritant has touched your skin. Symptoms include: Dry or flaky skin. Redness. Cracks. Itching. Pain or a burning feeling. Blisters. Blood or clear fluid draining from skin cracks. With allergic contact dermatitis, swelling may occur. This may happen in places such as the eyelids, mouth, or genitals. How is this treated? This condition is treated by checking for the cause of the reaction and protecting your skin. Treatment may also include: Steroid creams, ointments, or medicines. Antibiotic medicines or other ointments, if you have a skin infection. Lotion or medicines to help with itching. A bandage (dressing). Follow these instructions at home: Skin care Moisturize your skin as needed. Put cool cloths on your skin. Put a baking soda paste on your skin. Stir water into baking soda until it looks like a paste. Do not scratch your skin. Avoid having things rub up against your skin. Avoid  the use of soaps, perfumes, and dyes. Medicines Take or apply over-the-counter and prescription medicines only as told by your doctor. If you were prescribed an antibiotic medicine, take or apply it as told by your doctor. Do not stop using it even if your condition starts to get better. Bathing Take a bath with: Epsom salts. Baking soda. Colloidal oatmeal. Bathe less often. Bathe in warm water. Avoid using hot water. Bandage care If you were given a bandage, change it as told by your doctor. Wash your hands with soap and water before and after you change your bandage. If soap and water are not available, use hand sanitizer. General instructions Avoid the things that caused your reaction. If you do not know what caused it, keep a journal. Write down: What you eat. What skin products you use. What you drink. What you wear in the area that has symptoms. This includes jewelry. Check the affected areas every day for signs of infection. Check for: More redness, swelling, or pain. More fluid or blood. Warmth. Pus or a bad smell. Keep all follow-up visits as told by your doctor. This is important. Contact a doctor if: You do not get better with treatment. Your condition gets worse. You have signs of infection, such as: More swelling. Tenderness. More redness. Soreness. Warmth. You have a fever. You have new symptoms. Get help right away if: You have a very bad headache. You have neck pain. Your neck is stiff. You throw up (vomit). You feel very sleepy. You see red streaks coming from the area. Your bone or joint near the area hurts after the skin has healed. The area turns darker. You have trouble breathing. Summary Dermatitis is redness, soreness, and swelling of the skin.  Symptoms may occur where the irritant has touched you. Treatment may include medicines and skin care. If you do not know what caused your reaction, keep a journal. Contact a doctor if your condition  gets worse or you have signs of infection. This information is not intended to replace advice given to you by your health care provider. Make sure you discuss any questions you have with your health care provider. Document Revised: 09/26/2021 Document Reviewed: 09/26/2021 Elsevier Patient Education  [place].

## 2022-10-09 ENCOUNTER — Ambulatory Visit (INDEPENDENT_AMBULATORY_CARE_PROVIDER_SITE_OTHER): Payer: Medicare Other

## 2022-10-09 DIAGNOSIS — Z23 Encounter for immunization: Secondary | ICD-10-CM

## 2022-12-04 NOTE — Progress Notes (Signed)
[facility] Quality Team Note  Name: Bernard Hill Date of Birth: 06/27/1956 MRN: [id number] Date: 12/04/2022  [facility] Quality Team has reviewed this patient's chart, please see recommendations below:  [facility] Quality Other; (KED: Kidney Health Evaluation Gap- Patient needs Urine Albumin Creatinine Ratio Test and EGFR test for Gap Closure)

## 2022-12-08 DIAGNOSIS — Z79891 Long term (current) use of opiate analgesic: Secondary | ICD-10-CM | POA: Diagnosis not present

## 2022-12-08 DIAGNOSIS — M5416 Radiculopathy, lumbar region: Secondary | ICD-10-CM | POA: Diagnosis not present

## 2022-12-08 DIAGNOSIS — G894 Chronic pain syndrome: Secondary | ICD-10-CM | POA: Diagnosis not present

## 2022-12-08 DIAGNOSIS — M47818 Spondylosis without myelopathy or radiculopathy, sacral and sacrococcygeal region: Secondary | ICD-10-CM | POA: Diagnosis not present

## 2023-04-05 DIAGNOSIS — M62830 Muscle spasm of back: Secondary | ICD-10-CM | POA: Diagnosis not present

## 2023-04-05 DIAGNOSIS — G894 Chronic pain syndrome: Secondary | ICD-10-CM | POA: Diagnosis not present

## 2023-04-05 DIAGNOSIS — M47818 Spondylosis without myelopathy or radiculopathy, sacral and sacrococcygeal region: Secondary | ICD-10-CM | POA: Diagnosis not present

## 2023-04-05 DIAGNOSIS — M5416 Radiculopathy, lumbar region: Secondary | ICD-10-CM | POA: Diagnosis not present

## 2023-04-17 DIAGNOSIS — M170 Bilateral primary osteoarthritis of knee: Secondary | ICD-10-CM | POA: Diagnosis not present

## 2023-04-24 DIAGNOSIS — M170 Bilateral primary osteoarthritis of knee: Secondary | ICD-10-CM | POA: Diagnosis not present

## 2023-05-01 DIAGNOSIS — M170 Bilateral primary osteoarthritis of knee: Secondary | ICD-10-CM | POA: Diagnosis not present

## 2023-06-08 DIAGNOSIS — M62830 Muscle spasm of back: Secondary | ICD-10-CM | POA: Diagnosis not present

## 2023-06-08 DIAGNOSIS — M5416 Radiculopathy, lumbar region: Secondary | ICD-10-CM | POA: Diagnosis not present

## 2023-06-08 DIAGNOSIS — M47818 Spondylosis without myelopathy or radiculopathy, sacral and sacrococcygeal region: Secondary | ICD-10-CM | POA: Diagnosis not present

## 2023-06-08 DIAGNOSIS — G894 Chronic pain syndrome: Secondary | ICD-10-CM | POA: Diagnosis not present

## 2023-06-14 ENCOUNTER — Ambulatory Visit (INDEPENDENT_AMBULATORY_CARE_PROVIDER_SITE_OTHER): Payer: Medicare Other

## 2023-06-14 VITALS — Ht 69.0 in | Wt 260.0 lb

## 2023-06-14 DIAGNOSIS — Z0000 Encounter for general adult medical examination without abnormal findings: Secondary | ICD-10-CM | POA: Diagnosis not present

## 2023-06-14 NOTE — Progress Notes (Signed)
Subjective:   Bernard Hill is a 67 y.o. male who presents for Medicare Annual/Subsequent preventive examination.  Visit Complete: Virtual  I connected with  Bernard Hill on 06/14/23 by a audio enabled telemedicine application and verified that I am speaking with the correct person using two identifiers.  Patient Location: Home  Provider Location: Home Office  I discussed the limitations of evaluation and management by telemedicine. The patient expressed understanding and agreed to proceed.  Patient Medicare AWV questionnaire was completed by the patient on 06/14/2023; I have confirmed that all information answered by patient is correct and no changes since this date.  Review of Systems     Cardiac Risk Factors include: advanced age (>62men, >2 women);hypertension;dyslipidemia;male gender     Objective:    Today's Vitals   06/14/23 1035  Weight: 260 lb (117.9 kg)  Height: 5\' 9"  (1.753 m)   Body mass index is 38.4 kg/m.     06/14/2023   10:38 AM 06/05/2022   11:17 AM 05/25/2021    3:58 PM 08/16/2020    8:46 AM 04/19/2018   10:02 AM 04/11/2018   10:00 AM 05/22/2015    9:18 AM  Advanced Directives  Does Patient Have a Medical Advance Directive? No No No No Yes No No  Type of Advance Directive     Living will    Would patient like information on creating a medical advance directive? No - Patient declined No - Patient declined No - Patient declined No - Patient declined  No - Patient declined     Current Medications (verified) Outpatient Encounter Medications as of 06/14/2023  Medication Sig   cholecalciferol (VITAMIN D) 1000 units tablet Take 1,000 Units by mouth daily.   levocetirizine (XYZAL) 5 MG tablet Take 1 tablet (5 mg total) by mouth at bedtime as needed for allergies (ear itching).   LYRICA 100 MG capsule Take 100 mg by mouth 3 (three) times daily.   tadalafil (CIALIS) 5 MG tablet Take 5 mg by mouth daily.    Testosterone 1.62 % GEL SMARTSIG:2-3 pump Topical  Daily   triamcinolone cream (KENALOG) 0.1 % Apply a small amount to the affected areas in the external ears with the tip of your finger twice daily until rash resolves (typically 7-10 days)   No facility-administered encounter medications on file as of 06/14/2023.    Allergies (verified) Lisinopril   History: Past Medical History:  Diagnosis Date   Arthritis    BPH (benign prostatic hypertrophy)    Chronic back pain    History of kidney stones    Hypogonadism in male    Past Surgical History:  Procedure Laterality Date   CATARACT EXTRACTION W/PHACO Right 08/16/2020   Procedure: CATARACT EXTRACTION PHACO AND INTRAOCULAR LENS PLACEMENT (IOC);  Surgeon: Fabio Pierce, MD;  Location: [facility];  Service: Ophthalmology;  Laterality: Right;  CDE: 6.39   CATARACT EXTRACTION W/PHACO Left 09/06/2020   Procedure: CATARACT EXTRACTION PHACO AND INTRAOCULAR LENS PLACEMENT LEFT EYE;  Surgeon: Fabio Pierce, MD;  Location: [facility];  Service: Ophthalmology;  Laterality: Left;  CDE: 5.96   COLONOSCOPY     COLONOSCOPY WITH PROPOFOL N/A 04/19/2018   Procedure: COLONOSCOPY WITH PROPOFOL;  Surgeon: Malissa Hippo, MD;  Location: [facility];  Service: Endoscopy;  Laterality: N/A;  12:25   HAND SURGERY Right 2017   palm near middle finger    LUMBAR FUSION  90,94,96   x3   POLYPECTOMY  04/19/2018   Procedure: POLYPECTOMY;  Surgeon:  Malissa Hippo, MD;  Location: [facility];  Service: Endoscopy;;  colon   REPAIR EXTENSOR TENDON Right 09/01/2014   Procedure: EXPLORATION REPAIR FLEXOR SUPERFICIAL TENDON RIGHT MIDDLE FINGER  ;  Surgeon: Cindee Salt, MD;  Location: [facility];  Service: Orthopedics;  Laterality: Right;   Family History  Problem Relation Age of Onset   Cancer Mother        breast    Heart disease Mother    Hypertension Sister    Cancer Brother        bladder and colon    Asthma Daughter    Hypertension Daughter    Other Neg Hx    Social History   Socioeconomic  History   Marital status: Married    Spouse name: Lupita Leash   Number of children: 2   Years of education: Not on file   Highest education level: 12th grade  Occupational History   Occupation: truck Hospital doctor    Comment: part time   Tobacco Use   Smoking status: Former    Packs/day: 1.00    Years: 10.00    Additional pack years: 0.00    Total pack years: 10.00    Types: Cigarettes    Quit date: 08/29/1979    Years since quitting: 43.8   Smokeless tobacco: Never  Vaping Use   Vaping Use: Never used  Substance and Sexual Activity   Alcohol use: No   Drug use: No   Sexual activity: Not Currently    Birth control/protection: None  Other Topics Concern   Not on file  Social History Narrative   Not on file   Social Determinants of Health   Financial Resource Strain: Low Risk  (06/14/2023)   Overall Financial Resource Strain (CARDIA)    Difficulty of Paying Living Expenses: Not hard at all  Food Insecurity: No Food Insecurity (06/14/2023)   Hunger Vital Sign    Worried About Running Out of Food in the Last Year: Never true    Ran Out of Food in the Last Year: Never true  Transportation Needs: No Transportation Needs (06/05/2022)   PRAPARE - Administrator, Civil Service (Medical): No    Lack of Transportation (Non-Medical): No  Physical Activity: Insufficiently Active (06/14/2023)   Exercise Vital Sign    Days of Exercise per Week: 3 days    Minutes of Exercise per Session: 30 min  Stress: No Stress Concern Present (06/14/2023)   [employer] of Occupational Health - Occupational Stress Questionnaire    Feeling of Stress : Not at all  Social Connections: Moderately Integrated (06/14/2023)   Social Connection and Isolation Panel [NHANES]    Frequency of Communication with Friends and Family: More than three times a week    Frequency of Social Gatherings with Friends and Family: More than three times a week    Attends Religious Services: More than 4 times per year     Active Member of [employer] or Organizations: No    Attends Engineer, structural: Never    Marital Status: Married    Tobacco Counseling Counseling given: Not Answered   Clinical Intake:  Pre-visit preparation completed: Yes  Pain : No/denies pain     Nutritional Risks: None Diabetes: No  How often do you need to have someone help you when you read instructions, pamphlets, or other written materials from your doctor or pharmacy?: 1 - Never  Interpreter Needed?: No  Information entered by :: Renie Ora, LPN  Activities of Daily Living    06/14/2023   10:38 AM  In your present state of health, do you have any difficulty performing the following activities:  Hearing? 0  Vision? 0  Difficulty concentrating or making decisions? 0  Walking or climbing stairs? 0  Dressing or bathing? 0  Doing errands, shopping? 0  Preparing Food and eating ? N  Using the Toilet? N  In the past six months, have you accidently leaked urine? N  Do you have problems with loss of bowel control? N  Managing your Medications? N  Managing your Finances? N  Housekeeping or managing your Housekeeping? N    Patient Care Team: Gwenlyn Fudge, FNP as PCP - General (Family Medicine) Danella Maiers, [facility] (Pharmacist) Thyra Breed, MD (Anesthesiology) Delora Fuel, OD (Optometry)  Indicate any recent Medical Services you may have received from other than [facility] providers in the past year (date may be approximate).     Assessment:   This is a routine wellness examination for [facility].  Hearing/Vision screen Vision Screening - Comments:: Wears rx glasses - up to date with routine eye exams with  Dr.Martin   Dietary issues and exercise activities discussed:     Goals Addressed             This Visit's Progress    DIET - EAT MORE FRUITS AND VEGETABLES   On track      Depression Screen    06/14/2023   10:37 AM 09/12/2022    2:26 PM 06/06/2022    2:21 PM 06/05/2022   11:15 AM  08/03/2021    3:17 PM 05/25/2021    3:59 PM 05/03/2021    3:34 PM  PHQ 2/9 Scores  PHQ - 2 Score 0 0 0 0 0 0 0  PHQ- 9 Score     0      Fall Risk    06/14/2023   10:36 AM 09/12/2022    2:26 PM 06/06/2022    2:16 PM 06/05/2022   11:17 AM 08/03/2021    3:17 PM  Fall Risk   Falls in the past year? 0 0 0 1 0  Number falls in past yr: 0   0   Injury with Fall? 0   1   Risk for fall due to : No Fall Risks   Impaired balance/gait;History of fall(s)   Follow up Falls prevention discussed   Falls prevention discussed     MEDICARE RISK AT HOME:  Medicare Risk at Home - 06/14/23 1036     Any stairs in or around the home? No    If so, are there any without handrails? No    Home free of loose throw rugs in walkways, pet beds, electrical cords, etc? Yes    Adequate lighting in your home to reduce risk of falls? Yes    Life alert? No    Use of a cane, walker or w/c? No    Grab bars in the bathroom? Yes    Shower chair or bench in shower? Yes    Elevated toilet seat or a handicapped toilet? Yes             TIMED UP AND GO:  Was the test performed?  No    Cognitive Function:        06/14/2023   10:38 AM 06/05/2022   11:20 AM 05/25/2021    4:00 PM  6CIT Screen  What Year? 0 points 0 points 0 points  What month?  0 points 0 points 0 points  What time? 0 points 0 points 0 points  Count back from 20 0 points 0 points 0 points  Months in reverse 0 points 0 points 0 points  Repeat phrase 0 points 0 points 0 points  Total Score 0 points 0 points 0 points    Immunizations Immunization History  Administered Date(s) Administered   Fluad Quad(high Dose 65+) 11/02/2020, 10/28/2021, 09/12/2022, 10/09/2022   Influenza, Seasonal, Injecte, Preservative Fre 10/09/2019   PNEUMOCOCCAL CONJUGATE-20 10/28/2021   Pneumococcal Conjugate-13 10/09/2019, 11/02/2020   Tdap 10/09/2019   Tetanus 12/26/2015    TDAP status: Up to date  Flu Vaccine status: Up to date  Pneumococcal vaccine status: Up  to date  Covid-19 vaccine status: Declined, Education has been provided regarding the importance of this vaccine but patient still declined. Advised may receive this vaccine at local pharmacy or Health Dept.or vaccine clinic. Aware to provide a copy of the vaccination record if obtained from local pharmacy or Health Dept. Verbalized acceptance and understanding.  Qualifies for Shingles Vaccine? Yes   Zostavax completed No   Shingrix Completed?: No.    Education has been provided regarding the importance of this vaccine. Patient has been advised to call insurance company to determine out of pocket expense if they have not yet received this vaccine. Advised may also receive vaccine at local pharmacy or Health Dept. Verbalized acceptance and understanding.  Screening Tests Health Maintenance  Topic Date Due   COVID-19 Vaccine (1) Never done   Hepatitis C Screening  Never done   INFLUENZA VACCINE  07/26/2023   Medicare Annual Wellness (AWV)  06/13/2024   Colonoscopy  04/19/2025   DTaP/Tdap/Td (2 - Td or Tdap) 10/08/2029   Pneumonia Vaccine 67+ Years old  Completed   HPV VACCINES  Aged Out   Zoster Vaccines- Shingrix  Discontinued    Health Maintenance  Health Maintenance Due  Topic Date Due   COVID-19 Vaccine (1) Never done   Hepatitis C Screening  Never done    Colorectal cancer screening: Type of screening: Colonoscopy. Completed 04/19/2018. Repeat every 7 years  Lung Cancer Screening: (Low Dose CT Chest recommended if Age 84-80 years, 20 pack-year currently smoking OR have quit w/in 15years.) does not qualify.   Lung Cancer Screening Referral: n/a  Additional Screening:  Hepatitis C Screening: does not qualify;   Vision Screening: Recommended annual ophthalmology exams for early detection of glaucoma and other disorders of the eye. Is the patient up to date with their annual eye exam?  Yes  Who is the provider or what is the name of the office in which the patient attends  annual eye exams? Dr.Martin  If pt is not established with a provider, would they like to be referred to a provider to establish care? No .   Dental Screening: Recommended annual dental exams for proper oral hygiene  Diabetic Foot Exam: Diabetic Foot Exam: Overdue, Pt has been advised about the importance in completing this exam. Pt is scheduled for diabetic foot exam on next office visit .  Community Resource Referral / Chronic Care Management: CRR required this visit?  No   CCM required this visit?  No     Plan:     I have personally reviewed and noted the following in the patient's chart:   Medical and social history Use of alcohol, tobacco or illicit drugs  Current medications and supplements including opioid prescriptions. Patient is not currently taking opioid prescriptions. Functional ability and status  Nutritional status Physical activity Advanced directives List of other physicians Hospitalizations, surgeries, and ER visits in previous 12 months Vitals Screenings to include cognitive, depression, and falls Referrals and appointments  In addition, I have reviewed and discussed with patient certain preventive protocols, quality metrics, and best practice recommendations. A written personalized care plan for preventive services as well as general preventive health recommendations were provided to patient.     Lorrene Reid, LPN   04/02/8118   After Visit Summary: (MyChart) Due to this being a telephonic visit, the after visit summary with patients personalized plan was offered to patient via MyChart   Nurse Notes: none

## 2023-06-14 NOTE — Patient Instructions (Signed)
Bernard Hill , Thank you for taking time to come for your Medicare Wellness Visit. I appreciate your ongoing commitment to your health goals. Please review the following plan we discussed and let me know if I can assist you in the future.   These are the goals we discussed:  Goals       DIET - EAT MORE FRUITS AND VEGETABLES      Exercise 150 min/wk Moderate Activity      06/05/2022-Encouraged pt to increase as tolerated.       Metabolic Syndrome (pt-stated)      Current Barriers:  Unable to independently afford treatment regimen Unable to achieve control of metabolic syndrome   Pharmacist Clinical Goal(s):  Over the next 90 days, patient will verbalize ability to afford treatment regimen achieve control of weight as evidenced by improved metabolic syndrome through collaboration with PharmD and provider.    Interventions: 1:1 collaboration with Gwenlyn Fudge, FNP regarding development and update of comprehensive plan of care as evidenced by provider attestation and co-signature Inter-disciplinary care team collaboration (see longitudinal plan of care) Comprehensive medication review performed; medication list updated in electronic medical record  Overweight/Obesity Complicated by HLD: Unable to achieve goal weight loss through lifestyle modification alone; current treatment: Ozempic 1mg  -- insurance will not cover ozempic 2mg  Denies personal and family history of Medullary thyroid cancer (MTC) Application sent to [employer] patient assistance program for Ozempic 2mg  sq weekly (not on med list yet, will add when med is delivered/started) Sample of ozempic 2mg  given today Medications/Strategies previously tried: ozempic 1mg  sq weekly Baseline weight: 311; most recent weight: 299 Current exercise: walks Recommended ozempic 2mg   Assessed patient finances. Awaiting patient financials;  [employer] application faxed today; collaboration with provider  **Increase in Ozempic dose will  potentially benefit patient's lipid panel Lipid Panel     Component Value Date/Time   CHOL 163 08/03/2021 1633   TRIG 106 08/03/2021 1633   HDL 42 08/03/2021 1633   CHOLHDL 3.9 08/03/2021 1633   LDLCALC 102 (H) 08/03/2021 1633   LABVLDL 19 08/03/2021 1633    Patient Goals/Self-Care Activities Over the next 90 days, patient will:  - take medications as prescribed collaborate with provider on medication access solutions  Follow Up Plan: Telephone follow up appointment with care management team member scheduled for: 1 month       Weight (lb) < 200 lb (90.7 kg)        This is a list of the screening recommended for you and due dates:  Health Maintenance  Topic Date Due   COVID-19 Vaccine (1) Never done   Hepatitis C Screening  Never done   Flu Shot  07/26/2023   Medicare Annual Wellness Visit  06/13/2024   Colon Cancer Screening  04/19/2025   DTaP/Tdap/Td vaccine (2 - Td or Tdap) 10/08/2029   Pneumonia Vaccine  Completed   HPV Vaccine  Aged Out   Zoster (Shingles) Vaccine  Discontinued    Advanced directives: Advance directive discussed with you today. I have provided a copy for you to complete at home and have notarized. Once this is complete please bring a copy in to our office so we can scan it into your chart.   Conditions/risks identified: Aim for 30 minutes of exercise or brisk walking, 6-8 glasses of water, and 5 servings of fruits and vegetables each day.   Next appointment: Follow up in one year for your annual wellness visit.   Preventive Care 65 Years and  Older, Male  Preventive care refers to lifestyle choices and visits with your health care provider that can promote health and wellness. What does preventive care include? A yearly physical exam. This is also called an annual well check. Dental exams once or twice a year. Routine eye exams. Ask your health care provider how often you should have your eyes checked. Personal lifestyle choices,  including: Daily care of your teeth and gums. Regular physical activity. Eating a healthy diet. Avoiding tobacco and drug use. Limiting alcohol use. Practicing safe sex. Taking low doses of aspirin every day. Taking vitamin and mineral supplements as recommended by your health care provider. What happens during an annual well check? The services and screenings done by your health care provider during your annual well check will depend on your age, overall health, lifestyle risk factors, and family history of disease. Counseling  Your health care provider may ask you questions about your: Alcohol use. Tobacco use. Drug use. Emotional well-being. Home and relationship well-being. Sexual activity. Eating habits. History of falls. Memory and ability to understand (cognition). Work and work Astronomer. Screening  You may have the following tests or measurements: Height, weight, and BMI. Blood pressure. Lipid and cholesterol levels. These may be checked every 5 years, or more frequently if you are over 42 years old. Skin check. Lung cancer screening. You may have this screening every year starting at age 61 if you have a 30-pack-year history of smoking and currently smoke or have quit within the past 15 years. Fecal occult blood test (FOBT) of the stool. You may have this test every year starting at age 67. Flexible sigmoidoscopy or colonoscopy. You may have a sigmoidoscopy every 5 years or a colonoscopy every 10 years starting at age 74. Prostate cancer screening. Recommendations will vary depending on your family history and other risks. Hepatitis C blood test. Hepatitis B blood test. Sexually transmitted disease (STD) testing. Diabetes screening. This is done by checking your blood sugar (glucose) after you have not eaten for a while (fasting). You may have this done every 1-3 years. Abdominal aortic aneurysm (AAA) screening. You may need this if you are a current or former  smoker. Osteoporosis. You may be screened starting at age 42 if you are at high risk. Talk with your health care provider about your test results, treatment options, and if necessary, the need for more tests. Vaccines  Your health care provider may recommend certain vaccines, such as: Influenza vaccine. This is recommended every year. Tetanus, diphtheria, and acellular pertussis (Tdap, Td) vaccine. You may need a Td booster every 10 years. Zoster vaccine. You may need this after age 46. Pneumococcal 13-valent conjugate (PCV13) vaccine. One dose is recommended after age 44. Pneumococcal polysaccharide (PPSV23) vaccine. One dose is recommended after age 47. Talk to your health care provider about which screenings and vaccines you need and how often you need them. This information is not intended to replace advice given to you by your health care provider. Make sure you discuss any questions you have with your health care provider. Document Released: 01/07/2016 Document Revised: 08/30/2016 Document Reviewed: 10/12/2015 Elsevier Interactive Patient Education  2017 [employer].  Fall Prevention in the Home Falls can cause injuries. They can happen to people of all ages. There are many things you can do to make your home safe and to help prevent falls. What can I do on the outside of my home? Regularly fix the edges of walkways and driveways and fix any cracks.  Remove anything that might make you trip as you walk through a door, such as a raised step or threshold. Trim any bushes or trees on the path to your home. Use bright outdoor lighting. Clear any walking paths of anything that might make someone trip, such as rocks or tools. Regularly check to see if handrails are loose or broken. Make sure that both sides of any steps have handrails. Any raised decks and porches should have guardrails on the edges. Have any leaves, snow, or ice cleared regularly. Use sand or salt on walking paths during  winter. Clean up any spills in your garage right away. This includes oil or grease spills. What can I do in the bathroom? Use night lights. Install grab bars by the toilet and in the tub and shower. Do not use towel bars as grab bars. Use non-skid mats or decals in the tub or shower. If you need to sit down in the shower, use a plastic, non-slip stool. Keep the floor dry. Clean up any water that spills on the floor as soon as it happens. Remove soap buildup in the tub or shower regularly. Attach bath mats securely with double-sided non-slip rug tape. Do not have throw rugs and other things on the floor that can make you trip. What can I do in the bedroom? Use night lights. Make sure that you have a light by your bed that is easy to reach. Do not use any sheets or blankets that are too big for your bed. They should not hang down onto the floor. Have a firm chair that has side arms. You can use this for support while you get dressed. Do not have throw rugs and other things on the floor that can make you trip. What can I do in the kitchen? Clean up any spills right away. Avoid walking on wet floors. Keep items that you use a lot in easy-to-reach places. If you need to reach something above you, use a strong step stool that has a grab bar. Keep electrical cords out of the way. Do not use floor polish or wax that makes floors slippery. If you must use wax, use non-skid floor wax. Do not have throw rugs and other things on the floor that can make you trip. What can I do with my stairs? Do not leave any items on the stairs. Make sure that there are handrails on both sides of the stairs and use them. Fix handrails that are broken or loose. Make sure that handrails are as long as the stairways. Check any carpeting to make sure that it is firmly attached to the stairs. Fix any carpet that is loose or worn. Avoid having throw rugs at the top or bottom of the stairs. If you do have throw rugs,  attach them to the floor with carpet tape. Make sure that you have a light switch at the top of the stairs and the bottom of the stairs. If you do not have them, ask someone to add them for you. What else can I do to help prevent falls? Wear shoes that: Do not have high heels. Have rubber bottoms. Are comfortable and fit you well. Are closed at the toe. Do not wear sandals. If you use a stepladder: Make sure that it is fully opened. Do not climb a closed stepladder. Make sure that both sides of the stepladder are locked into place. Ask someone to hold it for you, if possible. Clearly mark and make sure that  you can see: Any grab bars or handrails. First and last steps. Where the edge of each step is. Use tools that help you move around (mobility aids) if they are needed. These include: Canes. Walkers. Scooters. Crutches. Turn on the lights when you go into a dark area. Replace any light bulbs as soon as they burn out. Set up your furniture so you have a clear path. Avoid moving your furniture around. If any of your floors are uneven, fix them. If there are any pets around you, be aware of where they are. Review your medicines with your doctor. Some medicines can make you feel dizzy. This can increase your chance of falling. Ask your doctor what other things that you can do to help prevent falls. This information is not intended to replace advice given to you by your health care provider. Make sure you discuss any questions you have with your health care provider. Document Released: 10/07/2009 Document Revised: 05/18/2016 Document Reviewed: 01/15/2015 Elsevier Interactive Patient Education  2017 [employer].

## 2023-06-21 ENCOUNTER — Ambulatory Visit (INDEPENDENT_AMBULATORY_CARE_PROVIDER_SITE_OTHER): Payer: Medicare Other | Admitting: Family Medicine

## 2023-06-21 ENCOUNTER — Encounter: Payer: Self-pay | Admitting: Family Medicine

## 2023-06-21 VITALS — BP 126/77 | HR 89 | Temp 98.5°F | Ht 69.0 in | Wt 302.0 lb

## 2023-06-21 DIAGNOSIS — E7849 Other hyperlipidemia: Secondary | ICD-10-CM

## 2023-06-21 DIAGNOSIS — M25562 Pain in left knee: Secondary | ICD-10-CM

## 2023-06-21 DIAGNOSIS — R351 Nocturia: Secondary | ICD-10-CM

## 2023-06-21 DIAGNOSIS — Z8639 Personal history of other endocrine, nutritional and metabolic disease: Secondary | ICD-10-CM | POA: Diagnosis not present

## 2023-06-21 DIAGNOSIS — M25561 Pain in right knee: Secondary | ICD-10-CM | POA: Diagnosis not present

## 2023-06-21 DIAGNOSIS — G8929 Other chronic pain: Secondary | ICD-10-CM

## 2023-06-21 DIAGNOSIS — R6889 Other general symptoms and signs: Secondary | ICD-10-CM | POA: Diagnosis not present

## 2023-06-21 DIAGNOSIS — Z6841 Body Mass Index (BMI) 40.0 and over, adult: Secondary | ICD-10-CM

## 2023-06-21 NOTE — Patient Instructions (Signed)
Earwax removal kit  Debrox  Spirals

## 2023-06-21 NOTE — Progress Notes (Signed)
New Patient Office Visit  Subjective   Patient ID: Bernard Hill, male    DOB: 02-08-1956  Age: 67 y.o. MRN: [id number]  CC:  Chief Complaint  Patient presents with   Establish Care   HPI DELAWRENCE FRIDMAN presents to establish care He follows with [facility] for low testosterone and ED.  Dr. Venia Carbon for pain management. Sees him 6 months. States that he stays established but he is not on pain management at this time. States that he still has back pain, but that it is management.  He is the primary caregiver of his wife, she recently received open heart surgery.   Bilateral Knee pain   States that he has pain with walking. He uses voltaren cream. States that it is managing well. States that he has followed with orthopedic and they recommended knee replacement but he is concerned for doing it.   Morbid obesity  Started Monday at [employer] weight loss.  Has made dietary changes. He has grilled foods, decreased breads. States that he has lost 9 lbs since Monday.   Occupation: truck Hospital doctor  Marital status: married  Substance use: none  Last eye exam: July 18th  Last dental exam: UTD - Dr. Katrinka Blazing  Last colonoscopy: Due April 2026. Brother passed from Colon Cancer  PSA: denies family hx. Gets up 1-2 times per night. Denies intermittent stream.  Refills needed today: none  Other specialists seen: ortho, urology Dermatology exam: 1 year ago in [place]  Fasting today:  no 2 boiled eggs this am Immunizations needed: Flu Vaccine: not due   Tdap Vaccine: not due   - every 33yrs - (<3 lifetime doses or unknown): all wounds -- look up need for Tetanus IG - (>=3 lifetime doses): clean/minor wound if >9yrs from previous; all other wounds if >32yrs from previous Zoster Vaccine: declines  (those >50yo, once) Pneumonia Vaccine: yes (those w/ risk factors) - (<34yr) Both: Immunocompromised, cochlear implant, CSF leak, asplenic, sickle cell, Chronic Renal Failure - (<70yr) PPSV-23 only:  Heart dz, lung disease, DM, tobacco abuse, alcoholism, cirrhosis/liver disease. - (>85yr): PPSV13 then PPSV23 in 6-12mths;  - (>54yr): repeat PPSV23 once if pt received prior to 69yo and 63yrs have passed   Outpatient Encounter Medications as of 06/21/2023  Medication Sig   cholecalciferol (VITAMIN D) 1000 units tablet Take 1,000 Units by mouth daily.   levocetirizine (XYZAL) 5 MG tablet Take 1 tablet (5 mg total) by mouth at bedtime as needed for allergies (ear itching).   LYRICA 100 MG capsule Take 100 mg by mouth 3 (three) times daily.   tadalafil (CIALIS) 5 MG tablet Take 5 mg by mouth daily.    Testosterone 1.62 % GEL SMARTSIG:2-3 pump Topical Daily   triamcinolone cream (KENALOG) 0.1 % Apply a small amount to the affected areas in the external ears with the tip of your finger twice daily until rash resolves (typically 7-10 days)   No facility-administered encounter medications on file as of 06/21/2023.    Past Medical History:  Diagnosis Date   Arthritis    BPH (benign prostatic hypertrophy)    Chronic back pain    History of kidney stones    Hypogonadism in male     Past Surgical History:  Procedure Laterality Date   CATARACT EXTRACTION W/PHACO Right 08/16/2020   Procedure: CATARACT EXTRACTION PHACO AND INTRAOCULAR LENS PLACEMENT (IOC);  Surgeon: Fabio Pierce, MD;  Location: [facility];  Service: Ophthalmology;  Laterality: Right;  CDE: 6.39   CATARACT EXTRACTION  W/PHACO Left 09/06/2020   Procedure: CATARACT EXTRACTION PHACO AND INTRAOCULAR LENS PLACEMENT LEFT EYE;  Surgeon: Fabio Pierce, MD;  Location: [facility];  Service: Ophthalmology;  Laterality: Left;  CDE: 5.96   COLONOSCOPY     COLONOSCOPY WITH PROPOFOL N/A 04/19/2018   Procedure: COLONOSCOPY WITH PROPOFOL;  Surgeon: Malissa Hippo, MD;  Location: [facility];  Service: Endoscopy;  Laterality: N/A;  12:25   HAND SURGERY Right 2017   palm near middle finger    LUMBAR FUSION  90,94,96   x3   POLYPECTOMY  04/19/2018    Procedure: POLYPECTOMY;  Surgeon: Malissa Hippo, MD;  Location: [facility];  Service: Endoscopy;;  colon   REPAIR EXTENSOR TENDON Right 09/01/2014   Procedure: EXPLORATION REPAIR FLEXOR SUPERFICIAL TENDON RIGHT MIDDLE FINGER  ;  Surgeon: Cindee Salt, MD;  Location: [facility];  Service: Orthopedics;  Laterality: Right;    Family History  Problem Relation Age of Onset   Cancer Mother        breast    Heart disease Mother    Hypertension Sister    Cancer Brother        bladder and colon    Asthma Daughter    Hypertension Daughter    Other Neg Hx     Social History   Socioeconomic History   Marital status: Married    Spouse name: Lupita Leash   Number of children: 2   Years of education: Not on file   Highest education level: 12th grade  Occupational History   Occupation: truck Hospital doctor    Comment: part time   Tobacco Use   Smoking status: Former    Packs/day: 1.00    Years: 10.00    Additional pack years: 0.00    Total pack years: 10.00    Types: Cigarettes    Quit date: 08/29/1979    Years since quitting: 43.8   Smokeless tobacco: Never  Vaping Use   Vaping Use: Never used  Substance and Sexual Activity   Alcohol use: No   Drug use: No   Sexual activity: Not Currently    Birth control/protection: None  Other Topics Concern   Not on file  Social History Narrative   Not on file   Social Determinants of Health   Financial Resource Strain: Low Risk  (06/14/2023)   Overall Financial Resource Strain (CARDIA)    Difficulty of Paying Living Expenses: Not hard at all  Food Insecurity: No Food Insecurity (06/14/2023)   Hunger Vital Sign    Worried About Running Out of Food in the Last Year: Never true    Ran Out of Food in the Last Year: Never true  Transportation Needs: No Transportation Needs (06/05/2022)   PRAPARE - Administrator, Civil Service (Medical): No    Lack of Transportation (Non-Medical): No  Physical Activity: Insufficiently Active  (06/14/2023)   Exercise Vital Sign    Days of Exercise per Week: 3 days    Minutes of Exercise per Session: 30 min  Stress: No Stress Concern Present (06/14/2023)   [employer] of Occupational Health - Occupational Stress Questionnaire    Feeling of Stress : Not at all  Social Connections: Moderately Integrated (06/14/2023)   Social Connection and Isolation Panel [NHANES]    Frequency of Communication with Friends and Family: More than three times a week    Frequency of Social Gatherings with Friends and Family: More than three times a week    Attends Religious  Services: More than 4 times per year    Active Member of Clubs or Organizations: No    Attends Banker Meetings: Never    Marital Status: Married  Catering manager Violence: Not At Risk (06/14/2023)   Humiliation, Afraid, Rape, and Kick questionnaire    Fear of Current or Ex-Partner: No    Emotionally Abused: No    Physically Abused: No    Sexually Abused: No    ROS As per HPI  Objective   BP 126/77   Pulse 89   Temp 98.5 F (36.9 C)   Ht 5\' 9"  (1.753 m)   Wt (!) 302 lb (137 kg)   SpO2 95%   BMI 44.60 kg/m   Physical Exam Constitutional:      General: He is awake. He is not in acute distress.    Appearance: Normal appearance. He is well-developed and well-groomed. He is morbidly obese. He is not ill-appearing, toxic-appearing or diaphoretic.  HENT:     Right Ear: Tympanic membrane normal.     Left Ear: Tympanic membrane normal.     Ears:     Comments: Cerumen present in bilateral ear canals, not impacted  Cardiovascular:     Rate and Rhythm: Normal rate.     Pulses: Normal pulses.          Radial pulses are 2+ on the right side and 2+ on the left side.       Posterior tibial pulses are 2+ on the right side and 2+ on the left side.     Heart sounds: Normal heart sounds. No murmur heard.    No gallop.  Pulmonary:     Effort: Pulmonary effort is normal. No respiratory distress.     Breath  sounds: Normal breath sounds. No stridor. No wheezing, rhonchi or rales.  Musculoskeletal:     Cervical back: Full passive range of motion without pain and neck supple.     Right lower leg: No edema.     Left lower leg: No edema.     Comments: Abnormal gait due to knee pain. Wearing bilateral soft knee braces   Skin:    General: Skin is warm.     Capillary Refill: Capillary refill takes less than 2 seconds.  Neurological:     General: No focal deficit present.     Mental Status: He is alert, oriented to person, place, and time and easily aroused. Mental status is at baseline.     GCS: GCS eye subscore is 4. GCS verbal subscore is 5. GCS motor subscore is 6.     Motor: No weakness.  Psychiatric:        Attention and Perception: Attention and perception normal.        Mood and Affect: Mood and affect normal.        Speech: Speech normal.        Behavior: Behavior normal. Behavior is cooperative.        Thought Content: Thought content normal. Thought content does not include homicidal or suicidal ideation. Thought content does not include homicidal or suicidal plan.        Cognition and Memory: Cognition and memory normal.        Judgment: Judgment normal.     Assessment & Plan:  1. Other hyperlipidemia Labs as below. Will communicate results to patient once available.  Not fasting. States that it is hard for him to complete during the week due to his job. Will collect today.  -  Lipid panel  2. Morbid obesity with BMI of 40.0-44.9, adult [facility]) Praised patient for the diet and lifestyle changes that he is currently making. Follows with [facility].   3. Personal history of other endocrine, nutritional and metabolic disease Labs as below. Will communicate results to patient once available.  - CBC with Differential/Platelet - CMP14+EGFR - TSH - VITAMIN D 25 Hydroxy (Vit-D Deficiency, Fractures)  4. Chronic pain of both knees Established with orthopedics.  Declined referral to PT at this time. Pain is controlled. Will refer back to Ortho.   5. Nocturia Labs as below. Will communicate results to patient once available.  - PR PSA SCREENING  The above assessment and management plan was discussed with the patient. The patient verbalized understanding of and has agreed to the management plan using shared-decision making. Patient is aware to call the clinic if they develop any new symptoms or if symptoms fail to improve or worsen. Patient is aware when to return to the clinic for a follow-up visit. Patient educated on when it is appropriate to go to the emergency department.   Return in about 6 months (around 12/21/2023) for Chronic Condition Follow up.   Neale Burly, DNP-FNP [facility] [address] [place], [place] [address] [phone]

## 2023-06-22 LAB — CMP14+EGFR
ALT: 27 IU/L (ref 0–44)
AST: 15 IU/L (ref 0–40)
Albumin: 4.3 g/dL (ref 3.9–4.9)
Alkaline Phosphatase: 88 IU/L (ref 44–121)
BUN/Creatinine Ratio: 21 (ref 10–24)
BUN: 15 mg/dL (ref 8–27)
Bilirubin Total: 0.8 mg/dL (ref 0.0–1.2)
CO2: 23 mmol/L (ref 20–29)
Calcium: 9.7 mg/dL (ref 8.6–10.2)
Chloride: 106 mmol/L (ref 96–106)
Creatinine, Ser: 0.72 mg/dL — ABNORMAL LOW (ref 0.76–1.27)
Globulin, Total: 2.4 g/dL (ref 1.5–4.5)
Glucose: 92 mg/dL (ref 70–99)
Potassium: 4.3 mmol/L (ref 3.5–5.2)
Sodium: 145 mmol/L — ABNORMAL HIGH (ref 134–144)
Total Protein: 6.7 g/dL (ref 6.0–8.5)
eGFR: 100 mL/min/{1.73_m2} (ref >59)

## 2023-06-22 LAB — CBC WITH DIFFERENTIAL/PLATELET
Basophils Absolute: 0 10*3/uL (ref 0.0–0.2)
Basos: 1 %
EOS (ABSOLUTE): 0.2 10*3/uL (ref 0.0–0.4)
Eos: 2 %
Hematocrit: 49.1 % (ref 37.5–51.0)
Hemoglobin: 16.2 g/dL (ref 13.0–17.7)
Immature Grans (Abs): 0 10*3/uL (ref 0.0–0.1)
Immature Granulocytes: 0 %
Lymphocytes Absolute: 1.4 10*3/uL (ref 0.7–3.1)
Lymphs: 18 %
MCH: 28 pg (ref 26.6–33.0)
MCHC: 33 g/dL (ref 31.5–35.7)
MCV: 85 fL (ref 79–97)
Monocytes Absolute: 0.7 10*3/uL (ref 0.1–0.9)
Monocytes: 9 %
Neutrophils Absolute: 5.4 10*3/uL (ref 1.4–7.0)
Neutrophils: 70 %
Platelets: 181 10*3/uL (ref 150–450)
RBC: 5.79 x10E6/uL (ref 4.14–5.80)
RDW: 13.1 % (ref 11.6–15.4)
WBC: 7.7 10*3/uL (ref 3.4–10.8)

## 2023-06-22 LAB — LIPID PANEL
Chol/HDL Ratio: 5.1 ratio — ABNORMAL HIGH (ref 0.0–5.0)
Cholesterol, Total: 172 mg/dL (ref 100–199)
HDL: 34 mg/dL — ABNORMAL LOW (ref >39)
LDL Chol Calc (NIH): 107 mg/dL — ABNORMAL HIGH (ref 0–99)
Triglycerides: 176 mg/dL — ABNORMAL HIGH (ref 0–149)
VLDL Cholesterol Cal: 31 mg/dL (ref 5–40)

## 2023-06-22 LAB — TSH: TSH: 1.68 u[IU]/mL (ref 0.450–4.500)

## 2023-06-22 LAB — VITAMIN D 25 HYDROXY (VIT D DEFICIENCY, FRACTURES): Vit D, 25-Hydroxy: 36.7 ng/mL (ref 30.0–100.0)

## 2023-06-22 MED ORDER — ROSUVASTATIN CALCIUM 10 MG PO TABS: 10.0000 mg | ORAL_TABLET | Freq: Every day | ORAL | 0 refills | Status: DC

## 2023-06-22 NOTE — Progress Notes (Signed)
Cholesterol is elevated. Diet encouraged - increase intake of fresh fruits and vegetables, increase intake of lean proteins. Bake, broil, or grill foods. Avoid fried, greasy, and fatty foods. Avoid fast foods. Increase intake of fiber-rich whole grains. Exercise encouraged - at least 150 minutes per week and advance as tolerated. Given his elevated ASCVD risk, would like to start patient on statin. Would like to start crestor if patient is agreeable. Creatine slightly decreased, Sodium is slightly elevated, likely due to dehydration, recommend 80-100 oz of water daily.   The 10-year ASCVD risk score (Arnett DK, et al., 2019) is: 21.5%   Values used to calculate the score:     Age: 67 years     Sex: Male     Is Non-Hispanic African American: No     Diabetic: No     Tobacco smoker: Yes     Systolic Blood Pressure: 126 mmHg     Is BP treated: No     HDL Cholesterol: 34 mg/dL     Total Cholesterol: 172 mg/dL

## 2023-06-22 NOTE — Addendum Note (Signed)
Addended by: Neale Burly on: 06/22/2023 11:49 AM   Modules accepted: Orders

## 2023-06-22 NOTE — Progress Notes (Signed)
Sent in one month supply of crestor 10 mg. If patient tolerates, will increase dose. Recommended for high-intensity statin based on ASCVD risk score.

## 2023-07-11 ENCOUNTER — Other Ambulatory Visit: Payer: Self-pay | Admitting: Family Medicine

## 2023-09-27 ENCOUNTER — Other Ambulatory Visit: Payer: Self-pay | Admitting: Family Medicine

## 2023-10-05 DIAGNOSIS — G894 Chronic pain syndrome: Secondary | ICD-10-CM | POA: Diagnosis not present

## 2023-10-05 DIAGNOSIS — M961 Postlaminectomy syndrome, not elsewhere classified: Secondary | ICD-10-CM | POA: Diagnosis not present

## 2023-10-05 DIAGNOSIS — M47818 Spondylosis without myelopathy or radiculopathy, sacral and sacrococcygeal region: Secondary | ICD-10-CM | POA: Diagnosis not present

## 2023-10-05 DIAGNOSIS — M62830 Muscle spasm of back: Secondary | ICD-10-CM | POA: Diagnosis not present

## 2023-10-06 ENCOUNTER — Other Ambulatory Visit: Payer: Self-pay | Admitting: Family Medicine

## 2023-10-06 DIAGNOSIS — H60543 Acute eczematoid otitis externa, bilateral: Secondary | ICD-10-CM

## 2023-11-13 DIAGNOSIS — M170 Bilateral primary osteoarthritis of knee: Secondary | ICD-10-CM | POA: Diagnosis not present

## 2023-11-20 DIAGNOSIS — M170 Bilateral primary osteoarthritis of knee: Secondary | ICD-10-CM | POA: Diagnosis not present

## 2023-11-26 DIAGNOSIS — M170 Bilateral primary osteoarthritis of knee: Secondary | ICD-10-CM | POA: Diagnosis not present

## 2023-12-24 DIAGNOSIS — M62830 Muscle spasm of back: Secondary | ICD-10-CM | POA: Diagnosis not present

## 2023-12-24 DIAGNOSIS — S39012A Strain of muscle, fascia and tendon of lower back, initial encounter: Secondary | ICD-10-CM | POA: Diagnosis not present

## 2023-12-27 ENCOUNTER — Ambulatory Visit (INDEPENDENT_AMBULATORY_CARE_PROVIDER_SITE_OTHER): Payer: Medicare Other

## 2023-12-27 DIAGNOSIS — Z23 Encounter for immunization: Secondary | ICD-10-CM | POA: Diagnosis not present

## 2023-12-27 DIAGNOSIS — S39012D Strain of muscle, fascia and tendon of lower back, subsequent encounter: Secondary | ICD-10-CM | POA: Diagnosis not present

## 2024-01-01 ENCOUNTER — Other Ambulatory Visit: Payer: Self-pay | Admitting: Family Medicine

## 2024-01-01 DIAGNOSIS — H60543 Acute eczematoid otitis externa, bilateral: Secondary | ICD-10-CM

## 2024-02-08 DIAGNOSIS — M62830 Muscle spasm of back: Secondary | ICD-10-CM | POA: Diagnosis not present

## 2024-02-08 DIAGNOSIS — G894 Chronic pain syndrome: Secondary | ICD-10-CM | POA: Diagnosis not present

## 2024-02-08 DIAGNOSIS — M47818 Spondylosis without myelopathy or radiculopathy, sacral and sacrococcygeal region: Secondary | ICD-10-CM | POA: Diagnosis not present

## 2024-02-08 DIAGNOSIS — M961 Postlaminectomy syndrome, not elsewhere classified: Secondary | ICD-10-CM | POA: Diagnosis not present

## 2024-02-27 DIAGNOSIS — R030 Elevated blood-pressure reading, without diagnosis of hypertension: Secondary | ICD-10-CM | POA: Diagnosis not present

## 2024-02-27 DIAGNOSIS — M5441 Lumbago with sciatica, right side: Secondary | ICD-10-CM | POA: Diagnosis not present

## 2024-03-03 DIAGNOSIS — M51362 Other intervertebral disc degeneration, lumbar region with discogenic back pain and lower extremity pain: Secondary | ICD-10-CM | POA: Diagnosis not present

## 2024-03-03 DIAGNOSIS — M47816 Spondylosis without myelopathy or radiculopathy, lumbar region: Secondary | ICD-10-CM | POA: Diagnosis not present

## 2024-03-03 DIAGNOSIS — M1611 Unilateral primary osteoarthritis, right hip: Secondary | ICD-10-CM | POA: Diagnosis not present

## 2024-05-06 ENCOUNTER — Ambulatory Visit: Payer: Self-pay | Admitting: Family Medicine

## 2024-05-06 NOTE — Progress Notes (Signed)
 Testosterone  within normal range. Continue to follow up with urology.

## 2024-06-16 ENCOUNTER — Ambulatory Visit (INDEPENDENT_AMBULATORY_CARE_PROVIDER_SITE_OTHER): Payer: Medicare Other

## 2024-06-16 VITALS — BP 126/77 | HR 89 | Ht 69.0 in | Wt 302.0 lb

## 2024-06-16 DIAGNOSIS — Z0000 Encounter for general adult medical examination without abnormal findings: Secondary | ICD-10-CM

## 2024-06-16 NOTE — Patient Instructions (Signed)
 Bernard Hill , Thank you for taking time out of your busy schedule to complete your Annual Wellness Visit with me. I enjoyed our conversation and look forward to speaking with you again next year. I, as well as your care team,  appreciate your ongoing commitment to your health goals. Please review the following plan we discussed and let me know if I can assist you in the future. Your Game plan/ To Do List    Follow up Visits: Next Medicare AWV with our clinical staff: 06/17/25 at 8:40a.m.   Next Office Visit with your provider: n/a  Clinician Recommendations:  Aim for 30 minutes of exercise or brisk walking, 6-8 glasses of water, and 5 servings of fruits and vegetables each day. N/a      This is a list of the screening recommended for you and due dates:  Health Maintenance  Topic Date Due   Hepatitis C Screening  06/20/2024*   COVID-19 Vaccine (1) 07/02/2025*   Flu Shot  07/25/2024   Colon Cancer Screening  04/19/2025   Medicare Annual Wellness Visit  06/16/2025   DTaP/Tdap/Td vaccine (2 - Td or Tdap) 10/08/2029   Pneumococcal Vaccine for age over 58  Completed   HPV Vaccine  Aged Out   Meningitis B Vaccine  Aged Out   Zoster (Shingles) Vaccine  Discontinued  *Topic was postponed. The date shown is not the original due date.    Advanced directives: (Declined) Advance directive discussed with you today. Even though you declined this today, please call our office should you change your mind, and we can give you the proper paperwork for you to fill out. Advance Care Planning is important because it:  [x]  Makes sure you receive the medical care that is consistent with your values, goals, and preferences  [x]  It provides guidance to your family and loved ones and reduces their decisional burden about whether or not they are making the right decisions based on your wishes.  Follow the link provided in your after visit summary or read over the paperwork we have mailed to you to help you  started getting your Advance Directives in place. If you need assistance in completing these, please reach out to us  so that we can help you!  See attachments for Preventive Care and Fall Prevention Tips.

## 2024-06-16 NOTE — Progress Notes (Signed)
 Subjective:   Bernard Hill is a 68 y.o. who presents for a Medicare Wellness preventive visit.  As a reminder, Annual Wellness Visits don't include a physical exam, and some assessments may be limited, especially if this visit is performed virtually. We may recommend an in-person follow-up visit with your provider if needed.  Visit Complete: Virtual I connected with  Bernard Hill on 06/16/24 by a audio enabled telemedicine application and verified that I am speaking with the correct person using two identifiers.  Patient Location: Home  Provider Location: Home Office  I discussed the limitations of evaluation and management by telemedicine. The patient expressed understanding and agreed to proceed.  Vital Signs: Because this visit was a virtual/telehealth visit, some criteria may be missing or patient reported. Any vitals not documented were not able to be obtained and vitals that have been documented are patient reported.  VideoDeclined- This patient declined Librarian, academic. Therefore the visit was completed with audio only.  Persons Participating in Visit: Patient.  AWV Questionnaire: No: Patient Medicare AWV questionnaire was not completed prior to this visit.  Cardiac Risk Factors include: advanced age (>83men, >17 women);male gender;obesity (BMI >30kg/m2)     Objective:    Today's Vitals   06/16/24 1003  BP: 126/77  Pulse: 89  Weight: (!) 302 lb (137 kg)  Height: 5' 9 (1.753 m)   Body mass index is 44.6 kg/m.     06/16/2024   10:10 AM 06/14/2023   10:38 AM 06/05/2022   11:17 AM 05/25/2021    3:58 PM 08/16/2020    8:46 AM 04/19/2018   10:02 AM 04/11/2018   10:00 AM  Advanced Directives  Does Patient Have a Medical Advance Directive? No No No No No Yes  No   Type of Advance Directive      Living will   Would patient like information on creating a medical advance directive?  No - Patient declined No - Patient declined No - Patient  declined No - Patient declined  No - Patient declined      Data saved with a previous flowsheet row definition    Current Medications (verified) Outpatient Encounter Medications as of 06/16/2024  Medication Sig   cholecalciferol (VITAMIN D ) 1000 units tablet Take 1,000 Units by mouth daily.   levocetirizine (XYZAL ) 5 MG tablet TAKE 1 TABLET BY MOUTH AT BEDTIME AS NEEDED FOR  ALLERGIES  (EAR  ITCHING)   LYRICA 100 MG capsule Take 100 mg by mouth 3 (three) times daily.   rosuvastatin  (CRESTOR ) 10 MG tablet TAKE ONE TABLET BY MOUTH EVERY DAY   tadalafil (CIALIS) 5 MG tablet Take 5 mg by mouth daily.    Testosterone  1.62 % GEL SMARTSIG:2-3 pump Topical Daily   triamcinolone  cream (KENALOG ) 0.1 % Apply a small amount to the affected areas in the external ears with the tip of your finger twice daily until rash resolves (typically 7-10 days)   No facility-administered encounter medications on file as of 06/16/2024.    Allergies (verified) Lisinopril   History: Past Medical History:  Diagnosis Date   Arthritis    BPH (benign prostatic hypertrophy)    Chronic back pain    History of kidney stones    Hypogonadism in male    Past Surgical History:  Procedure Laterality Date   CATARACT EXTRACTION W/PHACO Right 08/16/2020   Procedure: CATARACT EXTRACTION PHACO AND INTRAOCULAR LENS PLACEMENT (IOC);  Surgeon: Harrie Agent, MD;  Location: [facility];  Service: Ophthalmology;  Laterality: Right;  CDE: 6.39   CATARACT EXTRACTION W/PHACO Left 09/06/2020   Procedure: CATARACT EXTRACTION PHACO AND INTRAOCULAR LENS PLACEMENT LEFT EYE;  Surgeon: Harrie Agent, MD;  Location: [facility];  Service: Ophthalmology;  Laterality: Left;  CDE: 5.96   COLONOSCOPY     COLONOSCOPY WITH PROPOFOL  N/A 04/19/2018   Procedure: COLONOSCOPY WITH PROPOFOL ;  Surgeon: Golda Claudis PENNER, MD;  Location: [facility];  Service: Endoscopy;  Laterality: N/A;  12:25   HAND SURGERY Right 2017   palm near middle finger    LUMBAR FUSION   90,94,96   x3   POLYPECTOMY  04/19/2018   Procedure: POLYPECTOMY;  Surgeon: Golda Claudis PENNER, MD;  Location: [facility];  Service: Endoscopy;;  colon   REPAIR EXTENSOR TENDON Right 09/01/2014   Procedure: EXPLORATION REPAIR FLEXOR SUPERFICIAL TENDON RIGHT MIDDLE FINGER  ;  Surgeon: Arley Curia, MD;  Location: [facility];  Service: Orthopedics;  Laterality: Right;   Family History  Problem Relation Age of Onset   Cancer Mother        breast    Heart disease Mother    Hypertension Sister    Cancer Brother        bladder and colon    Asthma Daughter    Hypertension Daughter    Other Neg Hx    Social History   Socioeconomic History   Marital status: Married    Spouse name: Arland   Number of children: 2   Years of education: Not on file   Highest education level: 12th grade  Occupational History   Occupation: truck Hospital doctor    Comment: part time   Tobacco Use   Smoking status: Former    Current packs/day: 0.00    Average packs/day: 1 pack/day for 10.0 years (10.0 ttl pk-yrs)    Types: Cigarettes    Start date: 08/28/1969    Quit date: 08/29/1979    Years since quitting: 44.8   Smokeless tobacco: Never  Vaping Use   Vaping status: Never Used  Substance and Sexual Activity   Alcohol use: No   Drug use: No   Sexual activity: Not Currently    Birth control/protection: None  Other Topics Concern   Not on file  Social History Narrative   Not on file   Social Drivers of Health   Financial Resource Strain: Low Risk  (06/16/2024)   Overall Financial Resource Strain (CARDIA)    Difficulty of Paying Living Expenses: Not hard at all  Food Insecurity: No Food Insecurity (06/16/2024)   Hunger Vital Sign    Worried About Running Out of Food in the Last Year: Never true    Ran Out of Food in the Last Year: Never true  Transportation Needs: No Transportation Needs (06/16/2024)   PRAPARE - Administrator, Civil Service (Medical): No    Lack of Transportation  (Non-Medical): No  Physical Activity: Insufficiently Active (06/16/2024)   Exercise Vital Sign    Days of Exercise per Week: 3 days    Minutes of Exercise per Session: 30 min  Stress: No Stress Concern Present (06/16/2024)   [employer] of Occupational Health - Occupational Stress Questionnaire    Feeling of Stress: Not at all  Social Connections: Moderately Integrated (06/16/2024)   Social Connection and Isolation Panel    Frequency of Communication with Friends and Family: More than three times a week    Frequency of Social Gatherings with Friends and Family: More than three times a  week    Attends Religious Services: More than 4 times per year    Active Member of Clubs or Organizations: No    Attends Banker Meetings: Never    Marital Status: Married    Tobacco Counseling Counseling given: Yes    Clinical Intake:  Pre-visit preparation completed: Yes  Pain : No/denies pain     BMI - recorded: 44.6 Nutritional Status: BMI > 30  Obese Nutritional Risks: None Diabetes: No  No results found for: HGBA1C   How often do you need to have someone help you when you read instructions, pamphlets, or other written materials from your doctor or pharmacy?: 1 - Never  Interpreter Needed?: No  Information entered by :: Alia t/cma   Activities of Daily Living     06/16/2024   10:08 AM  In your present state of health, do you have any difficulty performing the following activities:  Hearing? 0  Vision? 0  Difficulty concentrating or making decisions? 0  Walking or climbing stairs? 0  Dressing or bathing? 0  Doing errands, shopping? 0  Preparing Food and eating ? N  Using the Toilet? N  In the past six months, have you accidently leaked urine? N  Do you have problems with loss of bowel control? N  Managing your Medications? N  Managing your Finances? N  Housekeeping or managing your Housekeeping? N    Patient Care Team: Milian, Marry Lenis, FNP as  PCP - General (Family Medicine) Billee Mliss BIRCH, [facility] (Pharmacist) Orlando Anes, MD (Anesthesiology) Vicci Mcardle, OD (Optometry)  I have updated your Care Teams any recent Medical Services you may have received from other providers in the past year.     Assessment:   This is a routine wellness examination for [facility].  Hearing/Vision screen Hearing Screening - Comments:: Pt denies hearing dif Vision Screening - Comments:: Pt denies vision dif/ uses reading glasses just for reading/pt goes to [facility] [address] In [place]/last ov was last yr   Goals Addressed             This Visit's Progress    Patient Stated       Pt would like to retired       Depression Screen     06/16/2024   10:12 AM 06/14/2023   10:37 AM 09/12/2022    2:26 PM 06/06/2022    2:21 PM 06/05/2022   11:15 AM 08/03/2021    3:17 PM 05/25/2021    3:59 PM  PHQ 2/9 Scores  PHQ - 2 Score 0 0 0 0 0 0 0  PHQ- 9 Score 0     0     Fall Risk     06/16/2024   10:04 AM 06/14/2023   10:36 AM 09/12/2022    2:26 PM 06/06/2022    2:16 PM 06/05/2022   11:17 AM  Fall Risk   Falls in the past year? 0 0 0 0 1  Number falls in past yr: 0 0   0  Injury with Fall? 0 0   1  Risk for fall due to : No Fall Risks No Fall Risks   Impaired balance/gait;History of fall(s)  Follow up Falls evaluation completed Falls prevention discussed   Falls prevention discussed      Data saved with a previous flowsheet row definition    MEDICARE RISK AT HOME:  Medicare Risk at Home Any stairs in or around the home?: Yes If so, are there any without handrails?: Yes Home  free of loose throw rugs in walkways, pet beds, electrical cords, etc?: Yes Adequate lighting in your home to reduce risk of falls?: Yes Life alert?: No Use of a cane, walker or w/c?: No Grab bars in the bathroom?: Yes Shower chair or bench in shower?: Yes Elevated toilet seat or a handicapped toilet?: Yes  TIMED UP AND GO:  Was the test performed?  no  Cognitive  Function: 6CIT completed        06/16/2024   10:13 AM 06/14/2023   10:38 AM 06/05/2022   11:20 AM 05/25/2021    4:00 PM  6CIT Screen  What Year? 0 points 0 points 0 points 0 points  What month? 0 points 0 points 0 points 0 points  What time? 0 points 0 points 0 points 0 points  Count back from 20 0 points 0 points 0 points 0 points  Months in reverse 0 points 0 points 0 points 0 points  Repeat phrase 0 points 0 points 0 points 0 points  Total Score 0 points 0 points 0 points 0 points    Immunizations Immunization History  Administered Date(s) Administered   Fluad Quad(high Dose 65+) 11/02/2020, 10/28/2021, 09/12/2022, 10/09/2022   Fluad Trivalent(High Dose 65+) 12/27/2023   Influenza, Seasonal, Injecte, Preservative Fre 10/09/2019   PNEUMOCOCCAL CONJUGATE-20 10/28/2021   Pneumococcal Conjugate-13 10/09/2019, 11/02/2020   Tdap 10/09/2019   Tetanus 12/26/2015    Screening Tests Health Maintenance  Topic Date Due   Medicare Annual Wellness (AWV)  06/13/2024   Hepatitis C Screening  06/20/2024 (Originally 09/13/1974)   COVID-19 Vaccine (1) 07/02/2025 (Originally 09/14/1961)   INFLUENZA VACCINE  07/25/2024   Colonoscopy  04/19/2025   DTaP/Tdap/Td (2 - Td or Tdap) 10/08/2029   Pneumococcal Vaccine: 50+ Years  Completed   HPV VACCINES  Aged Out   Meningococcal B Vaccine  Aged Out   Zoster Vaccines- Shingrix  Discontinued    Health Maintenance  Health Maintenance Due  Topic Date Due   Medicare Annual Wellness (AWV)  06/13/2024   Health Maintenance Items Addressed: See Nurse Notes at the end of this note  Additional Screening:  Vision Screening: Recommended annual ophthalmology exams for early detection of glaucoma and other disorders of the eye. Would you like a referral to an eye doctor? No    Dental Screening: Recommended annual dental exams for proper oral hygiene  Community Resource Referral / Chronic Care Management: CRR required this visit?  No   CCM required  this visit?  No   Plan:    I have personally reviewed and noted the following in the patient's chart:   Medical and social history Use of alcohol, tobacco or illicit drugs  Current medications and supplements including opioid prescriptions. Patient is not currently taking opioid prescriptions. Functional ability and status Nutritional status Physical activity Advanced directives List of other physicians Hospitalizations, surgeries, and ER visits in previous 12 months Vitals Screenings to include cognitive, depression, and falls Referrals and appointments  In addition, I have reviewed and discussed with patient certain preventive protocols, quality metrics, and best practice recommendations. A written personalized care plan for preventive services as well as general preventive health recommendations were provided to patient.   Ozie Ned, CMA   06/16/2024   After Visit Summary: (MyChart) Due to this being a telephonic visit, the after visit summary with patients personalized plan was offered to patient via MyChart   Notes: Nothing significant to report at this time.

## 2024-08-07 DIAGNOSIS — M62830 Muscle spasm of back: Secondary | ICD-10-CM | POA: Diagnosis not present

## 2024-08-07 DIAGNOSIS — M961 Postlaminectomy syndrome, not elsewhere classified: Secondary | ICD-10-CM | POA: Diagnosis not present

## 2024-08-07 DIAGNOSIS — M47818 Spondylosis without myelopathy or radiculopathy, sacral and sacrococcygeal region: Secondary | ICD-10-CM | POA: Diagnosis not present

## 2024-08-07 DIAGNOSIS — G894 Chronic pain syndrome: Secondary | ICD-10-CM | POA: Diagnosis not present

## 2024-08-18 DIAGNOSIS — M170 Bilateral primary osteoarthritis of knee: Secondary | ICD-10-CM | POA: Diagnosis not present

## 2024-09-03 DIAGNOSIS — M170 Bilateral primary osteoarthritis of knee: Secondary | ICD-10-CM | POA: Diagnosis not present

## 2024-09-08 DIAGNOSIS — M170 Bilateral primary osteoarthritis of knee: Secondary | ICD-10-CM | POA: Diagnosis not present

## 2024-09-12 DIAGNOSIS — S39012A Strain of muscle, fascia and tendon of lower back, initial encounter: Secondary | ICD-10-CM | POA: Diagnosis not present

## 2024-09-15 DIAGNOSIS — M170 Bilateral primary osteoarthritis of knee: Secondary | ICD-10-CM | POA: Diagnosis not present

## 2024-10-13 DIAGNOSIS — M170 Bilateral primary osteoarthritis of knee: Secondary | ICD-10-CM | POA: Diagnosis not present

## 2024-12-10 ENCOUNTER — Ambulatory Visit

## 2024-12-10 DIAGNOSIS — Z23 Encounter for immunization: Secondary | ICD-10-CM

## 2025-03-26 ENCOUNTER — Encounter: Admitting: Family Medicine

## 2025-06-17 ENCOUNTER — Ambulatory Visit: Payer: Self-pay
